# Patient Record
Sex: Male | Born: 1965
Health system: Southern US, Community
[De-identification: ages and names within clinical notes are randomized; demographics above are authoritative.]

## PROBLEM LIST (undated history)

## (undated) DIAGNOSIS — G4730 Sleep apnea, unspecified: Secondary | ICD-10-CM

## (undated) DIAGNOSIS — I10 Essential (primary) hypertension: Secondary | ICD-10-CM

## (undated) DIAGNOSIS — T783XXA Angioneurotic edema, initial encounter: Secondary | ICD-10-CM

## (undated) HISTORY — DX: Sleep apnea, unspecified: G47.30

## (undated) HISTORY — DX: Angioneurotic edema, initial encounter: T78.3XXA

## (undated) HISTORY — PX: OTHER SURGICAL HISTORY: SHX169

---

## 2016-07-02 ENCOUNTER — Inpatient Hospital Stay (HOSPITAL_COMMUNITY): Payer: Self-pay

## 2016-07-02 ENCOUNTER — Inpatient Hospital Stay (HOSPITAL_COMMUNITY)
Admission: EM | Admit: 2016-07-02 | Discharge: 2016-07-05 | DRG: 916 | Disposition: A | Discharge status: Home / Self Care | Payer: Self-pay | Attending: Family Medicine | Admitting: Family Medicine

## 2016-07-02 ENCOUNTER — Encounter (HOSPITAL_COMMUNITY): Payer: Self-pay | Admitting: Emergency Medicine

## 2016-07-02 DIAGNOSIS — I10 Essential (primary) hypertension: Secondary | ICD-10-CM | POA: Diagnosis present

## 2016-07-02 DIAGNOSIS — T464X5A Adverse effect of angiotensin-converting-enzyme inhibitors, initial encounter: Secondary | ICD-10-CM | POA: Diagnosis present

## 2016-07-02 DIAGNOSIS — T783XXA Angioneurotic edema, initial encounter: Principal | ICD-10-CM | POA: Diagnosis present

## 2016-07-02 DIAGNOSIS — T380X5A Adverse effect of glucocorticoids and synthetic analogues, initial encounter: Secondary | ICD-10-CM | POA: Diagnosis present

## 2016-07-02 DIAGNOSIS — D72829 Elevated white blood cell count, unspecified: Secondary | ICD-10-CM | POA: Diagnosis present

## 2016-07-02 HISTORY — DX: Essential (primary) hypertension: I10

## 2016-07-02 LAB — CBC
HEMATOCRIT: 44.2 % (ref 39.0–52.0)
Hemoglobin: 14.7 g/dL (ref 13.0–17.0)
MCH: 30.6 pg (ref 26.0–34.0)
MCHC: 33.3 g/dL (ref 30.0–36.0)
MCV: 91.9 fL (ref 78.0–100.0)
PLATELETS: 325 10*3/uL (ref 150–400)
RBC: 4.81 MIL/uL (ref 4.22–5.81)
RDW: 13.9 % (ref 11.5–15.5)
WBC: 16.3 10*3/uL — AB (ref 4.0–10.5)

## 2016-07-02 LAB — COMPREHENSIVE METABOLIC PANEL
ALK PHOS: 83 U/L (ref 38–126)
ALT: 40 U/L (ref 17–63)
AST: 20 U/L (ref 15–41)
Albumin: 3.7 g/dL (ref 3.5–5.0)
Anion gap: 11 (ref 5–15)
BILIRUBIN TOTAL: 0.7 mg/dL (ref 0.3–1.2)
BUN: 21 mg/dL — AB (ref 6–20)
CALCIUM: 9.4 mg/dL (ref 8.9–10.3)
CO2: 22 mmol/L (ref 22–32)
CREATININE: 1.04 mg/dL (ref 0.61–1.24)
Chloride: 101 mmol/L (ref 101–111)
Glucose, Bld: 115 mg/dL — ABNORMAL HIGH (ref 65–99)
Potassium: 4.1 mmol/L (ref 3.5–5.1)
Sodium: 134 mmol/L — ABNORMAL LOW (ref 135–145)
TOTAL PROTEIN: 7.8 g/dL (ref 6.5–8.1)

## 2016-07-02 LAB — CBC WITH DIFFERENTIAL/PLATELET
BASOS PCT: 0 %
Basophils Absolute: 0 10*3/uL (ref 0.0–0.1)
EOS ABS: 0.2 10*3/uL (ref 0.0–0.7)
Eosinophils Relative: 1 %
HCT: 44 % (ref 39.0–52.0)
Hemoglobin: 15 g/dL (ref 13.0–17.0)
LYMPHS ABS: 4.2 10*3/uL — AB (ref 0.7–4.0)
Lymphocytes Relative: 25 %
MCH: 31.4 pg (ref 26.0–34.0)
MCHC: 34.1 g/dL (ref 30.0–36.0)
MCV: 92.2 fL (ref 78.0–100.0)
MONO ABS: 1.4 10*3/uL — AB (ref 0.1–1.0)
Monocytes Relative: 8 %
Neutro Abs: 11.1 10*3/uL — ABNORMAL HIGH (ref 1.7–7.7)
Neutrophils Relative %: 66 %
PLATELETS: 312 10*3/uL (ref 150–400)
RBC: 4.77 MIL/uL (ref 4.22–5.81)
RDW: 13.8 % (ref 11.5–15.5)
WBC: 16.9 10*3/uL — ABNORMAL HIGH (ref 4.0–10.5)

## 2016-07-02 LAB — BASIC METABOLIC PANEL
Anion gap: 10 (ref 5–15)
BUN: 21 mg/dL — ABNORMAL HIGH (ref 6–20)
CO2: 26 mmol/L (ref 22–32)
Calcium: 9.9 mg/dL (ref 8.9–10.3)
Chloride: 100 mmol/L — ABNORMAL LOW (ref 101–111)
Creatinine, Ser: 1.29 mg/dL — ABNORMAL HIGH (ref 0.61–1.24)
GFR calc Af Amer: >60 mL/min (ref >60)
GLUCOSE: 120 mg/dL — AB (ref 65–99)
POTASSIUM: 3.7 mmol/L (ref 3.5–5.1)
Sodium: 136 mmol/L (ref 135–145)

## 2016-07-02 LAB — PROTIME-INR
INR: 0.97
Prothrombin Time: 12.8 seconds (ref 11.4–15.2)

## 2016-07-02 LAB — MRSA PCR SCREENING: MRSA by PCR: NEGATIVE

## 2016-07-02 LAB — GLUCOSE, CAPILLARY
GLUCOSE-CAPILLARY: 121 mg/dL — AB (ref 65–99)
GLUCOSE-CAPILLARY: 153 mg/dL — AB (ref 65–99)
GLUCOSE-CAPILLARY: 214 mg/dL — AB (ref 65–99)
Glucose-Capillary: 154 mg/dL — ABNORMAL HIGH (ref 65–99)

## 2016-07-02 LAB — SEDIMENTATION RATE: SED RATE: 42 mm/h — AB (ref 0–16)

## 2016-07-02 LAB — APTT: aPTT: 32 seconds (ref 24–36)

## 2016-07-02 LAB — PROCALCITONIN: PROCALCITONIN: 0.13 ng/mL

## 2016-07-02 MED ORDER — HEPARIN SODIUM (PORCINE) 5000 UNIT/ML IJ SOLN
5000.0000 [IU] | Freq: Three times a day (TID) | INTRAMUSCULAR | Status: DC
  Administered 2016-07-02 – 2016-07-03 (×3): 5000 [IU] via SUBCUTANEOUS
  Filled 2016-07-02 (×3): qty 1

## 2016-07-02 MED ORDER — FAMOTIDINE IN NACL 20-0.9 MG/50ML-% IV SOLN
20.0000 mg | Freq: Once | INTRAVENOUS | Status: AC
  Administered 2016-07-02: 20 mg via INTRAVENOUS
  Filled 2016-07-02: qty 50

## 2016-07-02 MED ORDER — DIPHENHYDRAMINE HCL 50 MG/ML IJ SOLN
25.0000 mg | Freq: Once | INTRAMUSCULAR | Status: AC
  Administered 2016-07-02: 25 mg via INTRAVENOUS
  Filled 2016-07-02: qty 1

## 2016-07-02 MED ORDER — INSULIN ASPART 100 UNIT/ML ~~LOC~~ SOLN
0.0000 [IU] | SUBCUTANEOUS | Status: DC
  Administered 2016-07-02 (×2): 2 [IU] via SUBCUTANEOUS
  Administered 2016-07-02 – 2016-07-03 (×3): 3 [IU] via SUBCUTANEOUS
  Administered 2016-07-03 (×2): 1 [IU] via SUBCUTANEOUS
  Administered 2016-07-04 (×2): 3 [IU] via SUBCUTANEOUS
  Administered 2016-07-04 (×2): 1 [IU] via SUBCUTANEOUS
  Administered 2016-07-05: 5 [IU] via SUBCUTANEOUS
  Administered 2016-07-05: 7 [IU] via SUBCUTANEOUS

## 2016-07-02 MED ORDER — METHYLPREDNISOLONE SODIUM SUCC 40 MG IJ SOLR
40.0000 mg | Freq: Two times a day (BID) | INTRAMUSCULAR | Status: DC
  Administered 2016-07-02: 40 mg via INTRAVENOUS
  Filled 2016-07-02: qty 1

## 2016-07-02 MED ORDER — DIPHENHYDRAMINE HCL 50 MG/ML IJ SOLN
25.0000 mg | Freq: Four times a day (QID) | INTRAMUSCULAR | Status: AC
  Administered 2016-07-02 – 2016-07-03 (×4): 25 mg via INTRAVENOUS
  Filled 2016-07-02 (×4): qty 1

## 2016-07-02 MED ORDER — SODIUM CHLORIDE 0.9 % IV SOLN
INTRAVENOUS | Status: DC
  Administered 2016-07-02: 10:00:00 via INTRAVENOUS

## 2016-07-02 MED ORDER — FAMOTIDINE IN NACL 20-0.9 MG/50ML-% IV SOLN
20.0000 mg | Freq: Two times a day (BID) | INTRAVENOUS | Status: DC
  Administered 2016-07-02 – 2016-07-05 (×6): 20 mg via INTRAVENOUS
  Filled 2016-07-02 (×7): qty 50

## 2016-07-02 MED ORDER — SODIUM CHLORIDE 0.9 % IV SOLN
INTRAVENOUS | Status: DC
  Administered 2016-07-02: 11:00:00 via INTRAVENOUS

## 2016-07-02 MED ORDER — METHYLPREDNISOLONE SODIUM SUCC 125 MG IJ SOLR
125.0000 mg | Freq: Once | INTRAMUSCULAR | Status: AC
  Administered 2016-07-02: 125 mg via INTRAVENOUS
  Filled 2016-07-02: qty 2

## 2016-07-02 NOTE — H&P (Signed)
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 Recent hospital stays just dc from [facility] with angioedema. Just dc and this am with progressive swelling under tongue. Able to mobilize secretions,. No SOB. No cp. No fevers. Able to phonate but abnormal from tongue. Presented to ER and called for admission. Was to see allergist but not completed. NO nEW meds except recent antihistamine at [facility]. No changes to home metop, hctz. No joint pain , rash   PAST MEDICAL HISTORY :   has a past medical history of Hypertension.  has no past surgical history on file. Prior to Admission medications   Not on File   Allergies not on file - ACEI  FAMILY HISTORY:  family history is not on file. no autoimmune, no c1qest inh def SOCIAL HISTORY:  reports that he has never smoked. He does not have any smokeless tobacco history on file. He reports that he does not drink alcohol.  REVIEW OF SYSTEMS:   Constitutional: Negative for fever, chills, weight loss, malaise/fatigue and diaphoresis.  HENT: Negative for hearing loss, ear pain, nosebleeds, congestion, sore throat, neck pain, tinnitus and ear discharge.   Eyes: Negative for blurred vision, double vision, photophobia, pain, discharge and redness.  Respiratory: Negative for cough, hemoptysis, sputum production, NO shortness of breath, wheezing and stridor.   Cardiovascular: Negative for chest pain, palpitations, orthopnea, claudication, leg swelling and PND.  Gastrointestinal: Negative for heartburn, nausea, vomiting, abdominal pain, diarrhea,  constipation, blood in stool and melena.  Genitourinary: Negative for dysuria, urgency, frequency, hematuria and flank pain.  Musculoskeletal: Negative for myalgias, back pain, joint pain and falls.  Skin: Negative for itching and rash.  Neurological: Negative for dizziness, tingling, tremors, sensory change, speech change, focal weakness, seizures, loss of consciousness, weakness and headaches.  Endo/Heme/Allergies: Negative for environmental allergies and polydipsia. Does not bruise/bleed easily.  SUBJECTIVE: no sob  VITAL SIGNS: Temp:  [98.2 F (36.8 C)] 98.2 F (36.8 C) (03/30 0922) Pulse Rate:  [87-90] 87 (03/30 0945) Resp:  [13-15] 13 (03/30 0945) BP: (146-160)/(107-115) 146/107 (03/30 0930) SpO2:  [97 %-100 %] 97 % (03/30 0945) Weight:  [95.3 kg (210 lb)] 95.3 kg (210 lb) (03/30 0954)  PHYSICAL EXAMINATION: General:  Awake, laert, no distress Neuro:  Alert  o x 4 HEENT:  Submandibular swelling, not warm, no rash, tongue ant wnl, under tongue swollen, can see phayngeal arches Cardiovascular:  s1 s2 RRR Lungs:  CTA Abdomen:  Soft, BS wnl, no r Musculoskeletal:  No edema, joints wnl Skin:  No rash  No results for input(s): NA, K, CL, CO2, BUN, CREATININE, GLUCOSE in the last 168 hours.  Recent Labs Lab 07/02/16 0942  HGB 15.0  HCT 44.0  WBC 16.9*  PLT 312   No results found.  ASSESSMENT / PLAN:  Angioedema , unclear etiology r/o compliment deficiency, r/o c1q est inh def r/o autoimmune  -empiric benadryl x 4 doses -pepcid 20 q12h bid -solumedral 40 bid -assess c1q esterase, c3, c4 -assess autoimmune panel, rf, ana, esr -assess lFT, lytes -NPO, will need SLp as involving base tongue -may require empiric  berinert if declines -xray chest -xray ap / lat neck -may need ent assessment, hope will take same clinical course as noted at [facility] with improvements I updated girlfriend and pt in full  Lavon Paganini. Titus Mould, MD, [place] Pgr: [facility]  Pulmonary and [place] Pager: [phone]  07/02/2016, 10:01 AM

## 2016-07-02 NOTE — ED Notes (Signed)
Dr feinstein at bedside  

## 2016-07-02 NOTE — ED Provider Notes (Signed)
[facility] DEPT Provider Note   CSN: [id number] Arrival date & time: 07/02/16  0918     History   Chief Complaint Chief Complaint  Patient presents with  . Allergic Reaction    HPI Robert Reyes is a 51 y.o. male.  51 year old male presents with upper airway edema as well as trouble swallowing consistent with his prior history of angioedema. Has been hospitalized several times for similar symptoms. Took Benadryl prior to arrival without relief. Denies any current use of Ace inhibitors. States he can handle his secretions. Symptoms have been progressively worse since this morning and now with increased swelling below his jaw. Denies any dyspnea.      Past Medical History:  Diagnosis Date  . Hypertension     Patient Active Problem List   Diagnosis Date Noted  . Angioedema 07/02/2016    History reviewed. No pertinent surgical history.     Home Medications    Prior to Admission medications   Medication Sig Start Date End Date Taking? Authorizing Provider  diphenhydrAMINE (BENADRYL) 25 MG tablet Take 25 mg by mouth every 8 (eight) hours.   Yes Historical Provider, MD  famotidine (PEPCID) 20 MG tablet Take 20 mg by mouth 2 (two) times daily.   Yes Historical Provider, MD  hydrochlorothiazide (HYDRODIURIL) 25 MG tablet Take 25 mg by mouth daily.   Yes Historical Provider, MD  metoprolol succinate (TOPROL-XL) 50 MG 24 hr tablet Take 50 mg by mouth daily. 06/12/16  Yes Historical Provider, MD    Family History No family history on file.  Social History Social History  Substance Use Topics  . Smoking status: Never Smoker  . Smokeless tobacco: Not on file  . Alcohol use No     Allergies   Ace inhibitors   Review of Systems Review of Systems  All other systems reviewed and are negative.    Physical Exam Updated Vital Signs BP (!) 145/108   Pulse 89   Temp 98.2 F (36.8 C) (Oral)   Resp 16   Ht 6' (1.829 m)   Wt 95.3 kg   SpO2 100%   BMI  28.48 kg/m   Physical Exam  Constitutional: He is oriented to person, place, and time. He appears well-developed and well-nourished.  Non-toxic appearance. No distress.  HENT:  Head: Normocephalic and atraumatic.  Mouth/Throat:    Eyes: Conjunctivae, EOM and lids are normal. Pupils are equal, round, and reactive to light.  Neck: Normal range of motion. Neck supple. No tracheal deviation present. No thyroid mass present.    Cardiovascular: Normal rate, regular rhythm and normal heart sounds.  Exam reveals no gallop.   No murmur heard. Pulmonary/Chest: Effort normal and breath sounds normal. No stridor. No respiratory distress. He has no decreased breath sounds. He has no wheezes. He has no rhonchi. He has no rales.  Abdominal: Soft. Normal appearance and bowel sounds are normal. He exhibits no distension. There is no tenderness. There is no rebound and no CVA tenderness.  Musculoskeletal: Normal range of motion. He exhibits no edema or tenderness.  Neurological: He is alert and oriented to person, place, and time. He has normal strength. No cranial nerve deficit or sensory deficit. GCS eye subscore is 4. GCS verbal subscore is 5. GCS motor subscore is 6.  Skin: Skin is warm and dry. No abrasion and no rash noted.  Psychiatric: He has a normal mood and affect. His speech is normal and behavior is normal.  Nursing note and vitals reviewed.  ED Treatments / Results  Labs (all labs ordered are listed, but only abnormal results are displayed) Labs Reviewed  CBC WITH DIFFERENTIAL/PLATELET - Abnormal; Notable for the following:       Result Value   WBC 16.9 (*)    Neutro Abs 11.1 (*)    Lymphs Abs 4.2 (*)    Monocytes Absolute 1.4 (*)    All other components within normal limits  BASIC METABOLIC PANEL - Abnormal; Notable for the following:    Chloride 100 (*)    Glucose, Bld 120 (*)    BUN 21 (*)    Creatinine, Ser 1.29 (*)    All other components within normal limits  HIV  ANTIBODY (ROUTINE TESTING)  CBC  COMPREHENSIVE METABOLIC PANEL  PROCALCITONIN  PROTIME-INR  APTT  C1 ESTERASE INHIBITOR  C3 COMPLEMENT  C4 COMPLEMENT  COMPLEMENT, TOTAL  ANA W/REFLEX IF POSITIVE  RHEUMATOID FACTOR  SEDIMENTATION RATE    EKG  EKG Interpretation None       Radiology No results found.  Procedures Procedures (including critical care time)  Medications Ordered in ED Medications  0.9 %  sodium chloride infusion ( Intravenous Stopped 07/02/16 1011)  heparin injection 5,000 Units (not administered)  famotidine (PEPCID) IVPB 20 mg premix (not administered)  0.9 %  sodium chloride infusion ( Intravenous Rate/Dose Change 07/02/16 1011)  methylPREDNISolone sodium succinate (SOLU-MEDROL) 40 mg/mL injection 40 mg (not administered)  diphenhydrAMINE (BENADRYL) injection 25 mg (not administered)  insulin aspart (novoLOG) injection 0-9 Units (not administered)  methylPREDNISolone sodium succinate (SOLU-MEDROL) 125 mg/2 mL injection 125 mg (125 mg Intravenous Given 07/02/16 0951)  famotidine (PEPCID) IVPB 20 mg premix (20 mg Intravenous New Bag/Given 07/02/16 0951)  diphenhydrAMINE (BENADRYL) injection 25 mg (25 mg Intravenous Given 07/02/16 0954)     Initial Impression / Assessment and Plan / ED Course  I have reviewed the triage vital signs and the nursing notes.  Pertinent labs & imaging results that were available during my care of the patient were reviewed by me and considered in my medical decision making (see chart for details).    CRITICAL CARE Performed by: Leota Jacobsen Total critical care time: 45 minutes Critical care time was exclusive of separately billable procedures and treating other patients. Critical care was necessary to treat or prevent imminent or life-threatening deterioration. Critical care was time spent personally by me on the following activities: development of treatment plan with patient and/or surrogate as well as nursing, discussions  with consultants, evaluation of patient's response to treatment, examination of patient, obtaining history from patient or surrogate, ordering and performing treatments and interventions, ordering and review of laboratory studies, ordering and review of radiographic studies, pulse oximetry and re-evaluation of patient's condition.   Patient treated with Solu-Medrol Benadryl and Pepcid. Discuss with Dr. Titus Mould from critical care and was seen the patient and patient will be admitted to the ICU.  Final Clinical Impressions(s) / ED Diagnoses   Final diagnoses:  Angioedema    New Prescriptions New Prescriptions   No medications on file     Lacretia Leigh, MD 07/02/16 1031

## 2016-07-02 NOTE — ED Notes (Signed)
X-ray at bedside

## 2016-07-02 NOTE — ED Triage Notes (Signed)
Per pt, states he was recently hospitalized in [place] for the same symptoms-swollen tongue and neck-states he was released on Tuesday-states he was to follow up with allergist-states woke up this am with swollen tongue and neck

## 2016-07-03 LAB — GLUCOSE, CAPILLARY
GLUCOSE-CAPILLARY: 147 mg/dL — AB (ref 65–99)
Glucose-Capillary: 149 mg/dL — ABNORMAL HIGH (ref 65–99)
Glucose-Capillary: 138 mg/dL — ABNORMAL HIGH (ref 65–99)
Glucose-Capillary: 129 mg/dL — ABNORMAL HIGH (ref 65–99)
Glucose-Capillary: 216 mg/dL — ABNORMAL HIGH (ref 65–99)
Glucose-Capillary: 223 mg/dL — ABNORMAL HIGH (ref 65–99)

## 2016-07-03 LAB — CBC
HEMATOCRIT: 41.4 % (ref 39.0–52.0)
Hemoglobin: 13.8 g/dL (ref 13.0–17.0)
MCH: 30.7 pg (ref 26.0–34.0)
MCHC: 33.3 g/dL (ref 30.0–36.0)
MCV: 92 fL (ref 78.0–100.0)
Platelets: 334 10*3/uL (ref 150–400)
RBC: 4.5 MIL/uL (ref 4.22–5.81)
RDW: 14 % (ref 11.5–15.5)
WBC: 20.9 10*3/uL — ABNORMAL HIGH (ref 4.0–10.5)

## 2016-07-03 LAB — BASIC METABOLIC PANEL
Anion gap: 10 (ref 5–15)
BUN: 24 mg/dL — AB (ref 6–20)
CO2: 25 mmol/L (ref 22–32)
Calcium: 9.5 mg/dL (ref 8.9–10.3)
Chloride: 102 mmol/L (ref 101–111)
Creatinine, Ser: 1.24 mg/dL (ref 0.61–1.24)
GFR calc Af Amer: >60 mL/min (ref >60)
GLUCOSE: 150 mg/dL — AB (ref 65–99)
POTASSIUM: 4.3 mmol/L (ref 3.5–5.1)
Sodium: 137 mmol/L (ref 135–145)

## 2016-07-03 LAB — HIV ANTIBODY (ROUTINE TESTING W REFLEX): HIV SCREEN 4TH GENERATION: NONREACTIVE

## 2016-07-03 LAB — RHEUMATOID FACTOR

## 2016-07-03 LAB — C4 COMPLEMENT: COMPLEMENT C4, BODY FLUID: 46 mg/dL — AB (ref 14–44)

## 2016-07-03 LAB — PHOSPHORUS: PHOSPHORUS: 3.6 mg/dL (ref 2.5–4.6)

## 2016-07-03 LAB — ANA W/REFLEX IF POSITIVE: ANA: NEGATIVE

## 2016-07-03 LAB — C3 COMPLEMENT: C3 COMPLEMENT: 215 mg/dL — AB (ref 82–167)

## 2016-07-03 LAB — MAGNESIUM: Magnesium: 2 mg/dL (ref 1.7–2.4)

## 2016-07-03 MED ORDER — METOPROLOL SUCCINATE ER 50 MG PO TB24
50.0000 mg | ORAL_TABLET | Freq: Every day | ORAL | Status: DC
  Administered 2016-07-03 – 2016-07-05 (×3): 50 mg via ORAL
  Filled 2016-07-03 (×3): qty 1

## 2016-07-03 MED ORDER — HYDROCHLOROTHIAZIDE 25 MG PO TABS
25.0000 mg | ORAL_TABLET | Freq: Every day | ORAL | Status: DC
  Administered 2016-07-03 – 2016-07-05 (×3): 25 mg via ORAL
  Filled 2016-07-03 (×3): qty 1

## 2016-07-03 MED ORDER — PREDNISONE 20 MG PO TABS
20.0000 mg | ORAL_TABLET | Freq: Two times a day (BID) | ORAL | Status: DC
  Administered 2016-07-03 – 2016-07-05 (×5): 20 mg via ORAL
  Filled 2016-07-03 (×5): qty 1

## 2016-07-03 NOTE — H&P (Signed)
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 Recent hospital stays just dc from [facility] with angioedema. Just dc and this am with progressive swelling under tongue. Able to mobilize secretions,. No SOB. No cp. No fevers. Able to phonate but abnormal from tongue. Presented to ER and called for admission. Was to see allergist but not completed. NO nEW meds except recent antihistamine at [facility]. No changes to home metop, hctz. No joint pain , rash   SUBJECTIVE: MAJOR improved swelling below tongue  VITAL SIGNS: Temp:  [98 F (36.7 C)-98.9 F (37.2 C)] 98.4 F (36.9 C) (03/31 0000) Pulse Rate:  [72-90] 74 (03/31 0400) Resp:  [12-25] 16 (03/31 0400) BP: (116-177)/(82-115) 116/82 (03/31 0400) SpO2:  [95 %-100 %] 96 % (03/31 0400) Weight:  [89.1 kg (196 lb 6.9 oz)-95.3 kg (210 lb)] 89.1 kg (196 lb 6.9 oz) (03/31 0500)  PHYSICAL EXAMINATION: General:  Awake, laert, no distress Neuro:  Alert  o x 4 HEENT:  Submandibular swelling near resolved Cardiovascular:  s1 s2 RRR Lungs:  CTA Abdomen:  Soft, BS wnl, no r Musculoskeletal:  No edema, joints wnl Skin:  No rash   Recent Labs Lab 07/02/16 0942 07/02/16 1112 07/03/16 0310  NA 136 134* 137  K 3.7 4.1 4.3  CL 100* 101 102  CO2 26 22 25   BUN 21* 21* 24*  CREATININE 1.29* 1.04 1.24  GLUCOSE 120* 115* 150*    Recent Labs Lab 07/02/16 0942 07/02/16 1112 07/03/16 0310  HGB 15.0 14.7 13.8  HCT 44.0 44.2 41.4  WBC 16.9* 16.3* 20.9*  PLT 312 325 334   Dg Neck Soft Tissue  Result Date: 07/02/2016 CLINICAL DATA:   Swollen tongue and neck. EXAM: NECK SOFT TISSUES - 1+ VIEW COMPARISON:  None. FINDINGS: There is no evidence of retropharyngeal soft tissue swelling or epiglottic enlargement. The cervical airway is unremarkable and no radio-opaque foreign body identified. There does appear to be prominence of the submandibular soft tissues of unknown etiology. IMPRESSION: Prominence of the submandibular tissues is noted of unknown etiology. Airway appears to be patent. CT scan may be performed for further evaluation. Electronically Signed   By: Marijo Conception, M.D.   On: 07/02/2016 10:49   Dg Chest Port 1 View  Result Date: 07/02/2016 CLINICAL DATA:  Difficulty swallowing with tongue swelling. EXAM: PORTABLE CHEST 1 VIEW COMPARISON:  None. FINDINGS: 1011 hours. The lungs are clear wiithout focal pneumonia, edema, pneumothorax or pleural effusion. The cardiopericardial silhouette is within normal limits for size. The visualized bony structures of the thorax are intact. Telemetry leads overlie the chest. IMPRESSION: No acute cardiopulmonary findings. Electronically Signed   By: Misty Stanley M.D.   On: 07/02/2016 10:43    ASSESSMENT / PLAN:  Angioedema , unclear etiology - favor Hereditary  r/o compliment deficiency, r/o c1q est inh def r/o autoimmune ( unlikely) No infection evident  -empiric benadryl x 4 doses - allow to dc -pepcid 20 q12h bid, maintain -solumedral 40 bid to pred this am  -assess c1q esterase ( pending) , c3, c4- reviewed - care everywhere labs  similar reviewed from [facility] -assess autoimmune panel, rf (neg) , ana, esr pending to follow -needs an allergist prior to dc home again, does he need BERINERT? - C1 Inh human pre discharge -dc sub q hep and ambulate -to [facility], floor -advance diet -crt at baseline likely  Lavon Paganini. Titus Mould, MD, [place] Pgr: [facility]  Pulmonary and [place] Pager: [phone]  07/03/2016, 7:52  AM

## 2016-07-04 LAB — GLUCOSE, CAPILLARY
GLUCOSE-CAPILLARY: 143 mg/dL — AB (ref 65–99)
GLUCOSE-CAPILLARY: 202 mg/dL — AB (ref 65–99)
Glucose-Capillary: 109 mg/dL — ABNORMAL HIGH (ref 65–99)
Glucose-Capillary: 111 mg/dL — ABNORMAL HIGH (ref 65–99)
Glucose-Capillary: 211 mg/dL — ABNORMAL HIGH (ref 65–99)
Glucose-Capillary: 322 mg/dL — ABNORMAL HIGH (ref 65–99)

## 2016-07-04 MED ORDER — HYDRALAZINE HCL 20 MG/ML IJ SOLN: 10.0000 mg | Freq: Four times a day (QID) | INTRAMUSCULAR | Status: DC | PRN

## 2016-07-04 NOTE — Progress Notes (Signed)
Patient Demographics:    Robert Reyes, is a 51 y.o. male, DOB - December 12, 1965, XBM:[id number]  Admit date - 07/02/2016   Admitting Physician Raylene Miyamoto, MD  Outpatient Primary MD for the patient is No PCP Per Patient  LOS - 2   Chief Complaint  Patient presents with  . Allergic Reaction        Subjective:    Robert Reyes today has no fevers, no emesis,  No chest pain,  No further tongue swelling   Assessment  & Plan :    Active Problems:   Angioedema  1)Angioedema , unclear etiology - favor Hereditary , patient continues to have recurrent episodes of tongue and lip swelling even after stopping ACE inhibitor. His brother apparently had is induced angioedema as well. Need to rule out hereditary angioedema, r/o compliment deficiency, r/o c1q est inh def, r/o autoimmune ( unlikely), No infection evident. Continue steroids, Benadryl and Pepcid, patient will need outpatient follow-up with allergist and immunologist. assess c1q esterase ( pending) , c3, c4- reviewed - -assess autoimmune panel, rf (neg) , ana, esr pending to follow -needs an allergist prior to dc home again, does he need BERINERT? - C1 Inh human pre discharge  2)HTN- stable continue metoprolol,  may use IV Hydralazine 10 mg  Every 4 hours Prn for systolic blood pressure over 160 mmhg  Code Status : Full    Disposition Plan  : home   Consults  :  ICU   DVT Prophylaxis  :    SCDs   Lab Results  Component Value Date   PLT 334 07/03/2016    Inpatient Medications  Scheduled Meds: . famotidine (PEPCID) IV  20 mg Intravenous Q12H  . hydrochlorothiazide  25 mg Oral Daily  . insulin aspart  0-9 Units Subcutaneous Q4H  . metoprolol succinate  50 mg Oral Daily  . predniSONE  20 mg Oral BID WC   Continuous Infusions: PRN Meds:.    Anti-infectives    None        Objective:   Vitals:   07/03/16 2023  07/03/16 2137 07/04/16 0500 07/04/16 0537  BP: (!) 145/108 (!) 131/101  (!) 135/103  Pulse: 83   64  Resp: 16   18  Temp: 98.5 F (36.9 C)   98 F (36.7 C)  TempSrc: Oral   Oral  SpO2: 99%   100%  Weight:   86.4 kg (190 lb 7.6 oz)   Height:        Wt Readings from Last 3 Encounters:  07/04/16 86.4 kg (190 lb 7.6 oz)     Intake/Output Summary (Last 24 hours) at 07/04/16 0802 Last data filed at 07/04/16 0537  Gross per 24 hour  Intake             1150 ml  Output             1200 ml  Net              -50 ml     Physical Exam  Gen:- Awake Alert,  In no apparent distress  HEENT:- [place].AT, No sclera icterus. No Tongue swelling Neck-Supple Neck,No JVD,.  Lungs-  CTAB  CV- S1, S2 normal Abd-  +ve B.Sounds, Abd Soft, No  tenderness,    Extremity/Skin:- No  edema,       Data Review:   Micro Results Recent Results (from the past 240 hour(s))  MRSA PCR Screening     Status: None   Collection Time: 07/02/16 11:00 AM  Result Value Ref Range Status   MRSA by PCR NEGATIVE NEGATIVE Final    Comment:        The GeneXpert MRSA Assay (FDA approved for NASAL specimens only), is one component of a comprehensive MRSA colonization surveillance program. It is not intended to diagnose MRSA infection nor to guide or monitor treatment for MRSA infections.     Radiology Reports Dg Neck Soft Tissue  Result Date: 07/02/2016 CLINICAL DATA:  Swollen tongue and neck. EXAM: NECK SOFT TISSUES - 1+ VIEW COMPARISON:  None. FINDINGS: There is no evidence of retropharyngeal soft tissue swelling or epiglottic enlargement. The cervical airway is unremarkable and no radio-opaque foreign body identified. There does appear to be prominence of the submandibular soft tissues of unknown etiology. IMPRESSION: Prominence of the submandibular tissues is noted of unknown etiology. Airway appears to be patent. CT scan may be performed for further evaluation. Electronically Signed   By: Marijo Conception, M.D.    On: 07/02/2016 10:49   Dg Chest Port 1 View  Result Date: 07/02/2016 CLINICAL DATA:  Difficulty swallowing with tongue swelling. EXAM: PORTABLE CHEST 1 VIEW COMPARISON:  None. FINDINGS: 1011 hours. The lungs are clear wiithout focal pneumonia, edema, pneumothorax or pleural effusion. The cardiopericardial silhouette is within normal limits for size. The visualized bony structures of the thorax are intact. Telemetry leads overlie the chest. IMPRESSION: No acute cardiopulmonary findings. Electronically Signed   By: Misty Stanley M.D.   On: 07/02/2016 10:43     CBC  Recent Labs Lab 07/02/16 0942 07/02/16 1112 07/03/16 0310  WBC 16.9* 16.3* 20.9*  HGB 15.0 14.7 13.8  HCT 44.0 44.2 41.4  PLT 312 325 334  MCV 92.2 91.9 92.0  MCH 31.4 30.6 30.7  MCHC 34.1 33.3 33.3  RDW 13.8 13.9 14.0  LYMPHSABS 4.2*  --   --   MONOABS 1.4*  --   --   EOSABS 0.2  --   --   BASOSABS 0.0  --   --     Chemistries   Recent Labs Lab 07/02/16 0942 07/02/16 1112 07/03/16 0310  NA 136 134* 137  K 3.7 4.1 4.3  CL 100* 101 102  CO2 _0 GLUCOSE 120* 115* 150*  BUN 21* 21* 24*  CREATININE 1.29* 1.04 1.24  CALCIUM 9.9 9.4 9.5  MG  --   --  2.0  AST  --  20  --   ALT  --  40  --   ALKPHOS  --  83  --   BILITOT  --  0.7  --    ------------------------------------------------------------------------------------------------------------------ No results for input(s): CHOL, HDL, LDLCALC, TRIG, CHOLHDL, LDLDIRECT in the last 72 hours.  No results found for: HGBA1C ------------------------------------------------------------------------------------------------------------------ No results for input(s): TSH, T4TOTAL, T3FREE, THYROIDAB in the last 72 hours.  Invalid input(s): FREET3 ------------------------------------------------------------------------------------------------------------------ No results for input(s): VITAMINB12, FOLATE, FERRITIN, TIBC, IRON, RETICCTPCT in the last 72  hours.  Coagulation profile  Recent Labs Lab 07/02/16 1112  INR 0.97    No results for input(s): DDIMER in the last 72 hours.  Cardiac Enzymes No results for input(s): CKMB, TROPONINI, MYOGLOBIN in the last 168 hours.  Invalid input(s): CK ------------------------------------------------------------------------------------------------------------------ No results found for: BNP  Denton Brick Orbin Mayeux M.D on 07/04/2016 at 8:02 AM  Between 7am to 7pm - Pager - [phone]  After 7pm go to [email or web address] - password [email or web address]  [facility] -  Office  [phone]  Dragon dictation system was used to create this note, attempts have been made to correct errors, however presence of uncorrected errors is not a reflection quality of care provided

## 2016-07-05 LAB — CBC
HCT: 40.1 % (ref 39.0–52.0)
Hemoglobin: 13.1 g/dL (ref 13.0–17.0)
MCH: 30.3 pg (ref 26.0–34.0)
MCHC: 32.7 g/dL (ref 30.0–36.0)
MCV: 92.8 fL (ref 78.0–100.0)
Platelets: 326 10*3/uL (ref 150–400)
RBC: 4.32 MIL/uL (ref 4.22–5.81)
RDW: 14 % (ref 11.5–15.5)
WBC: 18.2 10*3/uL — ABNORMAL HIGH (ref 4.0–10.5)

## 2016-07-05 LAB — GLUCOSE, CAPILLARY
GLUCOSE-CAPILLARY: 253 mg/dL — AB (ref 65–99)
GLUCOSE-CAPILLARY: 117 mg/dL — AB (ref 65–99)
Glucose-Capillary: 109 mg/dL — ABNORMAL HIGH (ref 65–99)

## 2016-07-05 LAB — C1 ESTERASE INHIBITOR: C1 ESTERASE INH: 51 mg/dL — AB (ref 21–39)

## 2016-07-05 MED ORDER — FAMOTIDINE 20 MG PO TABS
20.0000 mg | ORAL_TABLET | Freq: Two times a day (BID) | ORAL | 1 refills | Status: DC
Start: 2016-07-05 — End: 2016-08-16

## 2016-07-05 MED ORDER — PREDNISONE 20 MG PO TABS: 20.0000 mg | ORAL_TABLET | Freq: Every day | ORAL | 0 refills | Status: DC

## 2016-07-05 MED ORDER — DIPHENHYDRAMINE HCL 25 MG PO TABS: 25.0000 mg | ORAL_TABLET | Freq: Three times a day (TID) | ORAL | 1 refills | Status: DC | PRN

## 2016-07-05 MED ORDER — AMLODIPINE BESYLATE 10 MG PO TABS: 10.0000 mg | ORAL_TABLET | Freq: Every day | ORAL | 0 refills | Status: DC

## 2016-07-05 MED FILL — predniSONE 20 MG TABS: 20 | 20 days supply | Qty: 20 | Fill #0

## 2016-07-05 MED FILL — AMLODIPINE BESYLATE 10 MG T: 10 | 30 days supply | Qty: 30 | Fill #0

## 2016-07-05 NOTE — Discharge Summary (Addendum)
Robert Reyes, is a 51 y.o. male  DOB 09/23/1965  MRN [id number].  Admission date:  07/02/2016  Admitting Physician  Raylene Miyamoto, MD  Discharge Date:  07/05/2016   Primary MD  No PCP Per Patient  Recommendations for primary care physician for things to follow:   Follow-up with allergist as advised   Admission Diagnosis  Angioedema [T78.3XXA] Angioedema, initial encounter [T78.3XXA]   Discharge Diagnosis  Angioedema [T78.3XXA] Angioedema, initial encounter [T78.3XXA]    Active Problems:   Angioedema      Past Medical History:  Diagnosis Date  . Hypertension     Past Surgical History:  Procedure Laterality Date  . surgery on genitals    . 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 Recent hospital stays just dc from [facility] with angioedema. Just dc and this am with progressive swelling under tongue. Able to mobilize secretions,. No SOB. No cp. No fevers. Able to phonate but abnormal from tongue. Presented to ER and called for admission. Was to see allergist but not completed. NO nEW meds except recent antihistamine at [place]. No changes to home metop, hctz. No joint pain , rash     Hospital Course:     1)Angioedema , unclear etiology - favor Hereditary , patient continues to have recurrent episodes of tongue and lip swelling even after stopping ACE inhibitor. His brother apparently had ACEI induced angioedema as well. Need to rule out hereditary angioedema ( C1 esterase inhibitor is pending to R/o c1q est  inh def) . C4 and C3 are elevated so NO compliment deficiency,  ESR is only 42, ANA and Rheumatoid are Neg so autoimmune is unlikely), No infection evident (pro-calcitonin is not elevated). Treated with steroids, Benadryl and Pepcid. Discharge home on same.  ???? does he need [place]  appointment with Dr. Jerel Shepherd (Allergist) on Thursday 07/22/16... At 1330 PM   2)HTN-  ACE inhibitor related angioedema, continues to have symptoms metoprolol and HCTZ,  stop HCTZ and metoprolol, use amlodipine for BP control for now .  3)Steroid induced Leukocytosis- no fevers no evidence of infection, pro-calcitonin was not elevated   Discharge instructions:-  1)You have ReCurrent Angioedema which can be life-threatening 2) we are not sure what is causing you angioedema, it may be hereditary, it may also be triggered by medications or other exposures 3)Stop hydrochlorothiazide and metoprolol 4)Start Amlodipine 10 mg daily for blood pressure 5)Continue Benadryl as needed, take Pepcid twice a day and Prednisone 20 mg daily until you are told to stop these medications by the allergy Physician 6)You have an appointment with Dr. Jerel Shepherd on  Thursday 07/22/16... At 1330 PM at [facility] in [place], [place] [address]. (phone Number... [phone]) please be sure to keep your appointment 8) please take oral medications along including over-the-counter medications to your appointment 9)Go to the nearest emergency room if your tongue swelling and difficulty breathing occurs again or call 911  Discharge Condition: Stable  Follow UP with Dr. Verlin Fester the allergies as advised   Consults obtained - Dr Titus Mould  Diet and Activity recommendation:  As advised  Discharge Instructions    Discharge Instructions    Call MD for:  difficulty breathing, headache or visual disturbances    Complete by:  As directed    Call MD for:  hives    Complete by:  As directed    Call MD for:  persistant dizziness or  light-headedness    Complete by:  As directed    Call MD for:  persistant nausea and vomiting    Complete by:  As directed    Call MD for:  temperature >100.4    Complete by:  As directed    Diet - low sodium heart healthy    Complete by:  As directed    Discharge instructions    Complete by:  As directed    1)You have ReCurrent Angioedema which can be life-threatening 2) we are not sure what is causing you angioedema, it may be hereditary, it may also be triggered by medications or other exposures 3)Stop hydrochlorothiazide and metoprolol 4)Start Amlodipine 10 mg daily for blood pressure 5)Continue Benadryl as needed, take Pepcid twice a day and Prednisone 20 mg daily until you are told to stop these medications by the allergy Physician 6)You have an appointment with Dr. Jerel Shepherd on Thursday 07/22/16... At 1330 PM at [facility] in [place], [place] [address]. (phone Number... [phone]) please be sure to keep your appointment 8) please take oral medications along including over-the-counter medications to your appointment 9)Go to the nearest emergency room if your tongue swelling and difficulty breathing occurs again or call 911   Increase activity slowly    Complete by:  As directed         Discharge Medications     Allergies as of 07/05/2016      Reactions   Ace Inhibitors Swelling   Angioedema      Medication List    STOP taking these medications   hydrochlorothiazide 25 MG tablet Commonly known as:  HYDRODIURIL   metoprolol succinate 50 MG 24 hr tablet Commonly known as:  TOPROL-XL     TAKE these medications   amLODipine 10 MG tablet Commonly known as:  NORVASC Take 1 tablet (10 mg total) by mouth daily.   diphenhydrAMINE 25 MG tablet Commonly known as:  BENADRYL Take 1 tablet (25 mg total) by mouth every 8 (eight) hours as needed. What changed:  when to take this  reasons to take this   famotidine 20 MG tablet Commonly known as:  PEPCID Take 1  tablet (20 mg total) by mouth 2 (two) times daily.   predniSONE 20 MG tablet Commonly known as:  DELTASONE Take 1 tablet (20 mg total) by mouth daily with breakfast.       Major procedures and Radiology Reports - PLEASE review detailed and final reports for all details, in brief -   Dg Neck Soft Tissue  Result Date: 07/02/2016 CLINICAL DATA:  Swollen tongue and neck. EXAM: NECK SOFT TISSUES - 1+ VIEW COMPARISON:  None. FINDINGS: There is no evidence  of retropharyngeal soft tissue swelling or epiglottic enlargement. The cervical airway is unremarkable and no radio-opaque foreign body identified. There does appear to be prominence of the submandibular soft tissues of unknown etiology. IMPRESSION: Prominence of the submandibular tissues is noted of unknown etiology. Airway appears to be patent. CT scan may be performed for further evaluation. Electronically Signed   By: Marijo Conception, M.D.   On: 07/02/2016 10:49   Dg Chest Port 1 View  Result Date: 07/02/2016 CLINICAL DATA:  Difficulty swallowing with tongue swelling. EXAM: PORTABLE CHEST 1 VIEW COMPARISON:  None. FINDINGS: 1011 hours. The lungs are clear wiithout focal pneumonia, edema, pneumothorax or pleural effusion. The cardiopericardial silhouette is within normal limits for size. The visualized bony structures of the thorax are intact. Telemetry leads overlie the chest. IMPRESSION: No acute cardiopulmonary findings. Electronically Signed   By: Misty Stanley M.D.   On: 07/02/2016 10:43    Micro Results   Recent Results (from the past 240 hour(s))  MRSA PCR Screening     Status: None   Collection Time: 07/02/16 11:00 AM  Result Value Ref Range Status   MRSA by PCR NEGATIVE NEGATIVE Final    Comment:        The GeneXpert MRSA Assay (FDA approved for NASAL specimens only), is one component of a comprehensive MRSA colonization surveillance program. It is not intended to diagnose MRSA infection nor to guide or monitor treatment  for MRSA infections.     Today   Subjective    Robert Reyes today has no new complaints, speech is normal, eating and drinking well, no rash, no itching, girlfriend at bedside          Patient has been seen and examined prior to discharge   Objective   Blood pressure 139/90, pulse 69, temperature 98.3 F (36.8 C), temperature source Oral, resp. rate 16, height 6' (1.829 m), weight 86.3 kg (190 lb 4.1 oz), SpO2 100 %.   Intake/Output Summary (Last 24 hours) at 07/05/16 1459 Last data filed at 07/05/16 1354  Gross per 24 hour  Intake              840 ml  Output                0 ml  Net              840 ml    Exam Gen:- Awake  In no apparent distress , speech is completely clear HEENT:- [place].AT, No tongue swelling, no oral pharyngeal swelling , no swelling of the lips or neck Neck-Supple Neck,No JVD, prior trach scar Lungs- mostly clear  CV- S1, S2 normal Abd-  +ve B.Sounds, Abd Soft, No tenderness,    Extremity/Skin:- Intact peripheral pulses     Data Review   CBC w Diff:  Lab Results  Component Value Date   WBC 18.2 (H) 07/05/2016   HGB 13.1 07/05/2016   HCT 40.1 07/05/2016   PLT 326 07/05/2016   LYMPHOPCT 25 07/02/2016   MONOPCT 8 07/02/2016   EOSPCT 1 07/02/2016   BASOPCT 0 07/02/2016    CMP:  Lab Results  Component Value Date   NA 137 07/03/2016   K 4.3 07/03/2016   CL 102 07/03/2016   CO2 25 07/03/2016   BUN 24 (H) 07/03/2016   CREATININE 1.24 07/03/2016   PROT 7.8 07/02/2016   ALBUMIN 3.7 07/02/2016   BILITOT 0.7 07/02/2016   ALKPHOS 83 07/02/2016   AST 20 07/02/2016   ALT  40 07/02/2016    Total Discharge time is about 33 minutes  Kandyce Dieguez M.D on 07/05/2016 at 2:59 PM  [facility]   Office  [phone]  Dragon dictation system was used to create this note, attempts have been made to correct errors, however presence of uncorrected errors is not a reflection quality of care provided

## 2016-07-05 NOTE — Progress Notes (Signed)
Discharge instructions explained to pt and his girlfriend., He has an appointment with Dr. Starling Manns an allergist.Prescriptions given to pt and also a note about returning to work. Pt refuses to use a wheelchair for discharged.

## 2016-07-05 NOTE — Discharge Instructions (Signed)
Discharge instructions:-  1)You have ReCurrent Angioedema which can be life-threatening 2) we are not sure what is causing you angioedema, it may be hereditary, it may also be triggered by medications or other exposures 3)Stop hydrochlorothiazide and metoprolol 4)Start Amlodipine 10 mg daily for blood pressure 5)Continue Benadryl as needed, take Pepcid twice a day and Prednisone 20 mg daily until you are told to stop these medications by the allergy Physician 6)You have an appointment with Dr. Jerel Shepherd on Thursday 07/22/16... At 1330 PM at [facility] in [place], [place] [address]. (phone Number... [phone]) please be sure to keep your appointment 8) please take oral medications along including over-the-counter medications to your appointment 9)Go to the nearest emergency room if your tongue swelling and difficulty breathing occurs again or call 911   July 05, 2016    Patient: Robert Reyes  Date of Birth: [birth year]  Date of Visit: 07/02/2016    To Whom It May Concern:  It is my medical opinion that Robert Reyes should remain out of work until 07/07/16  Admitted to hospital on 07/02/16 for a serious medical condition  If you have any questions or concerns, please don't hesitate to call.  Sincerely,    Roxan Hockey, MD

## 2016-07-06 LAB — COMPLEMENT, TOTAL: Compl, Total (CH50): >60 U/mL — ABNORMAL HIGH (ref 42–60)

## 2016-07-22 ENCOUNTER — Ambulatory Visit (INDEPENDENT_AMBULATORY_CARE_PROVIDER_SITE_OTHER): Payer: Self-pay | Admitting: Allergy and Immunology

## 2016-07-22 ENCOUNTER — Encounter: Payer: Self-pay | Admitting: Allergy and Immunology

## 2016-07-22 DIAGNOSIS — T783XXD Angioneurotic edema, subsequent encounter: Secondary | ICD-10-CM

## 2016-07-22 DIAGNOSIS — I10 Essential (primary) hypertension: Secondary | ICD-10-CM

## 2016-07-22 NOTE — Assessment & Plan Note (Signed)
The patient's history strongly suggests angioedema secondary to ACE inhibitor use. Stuttering angioedema may persist for several weeks after discontinuation.   Use anti-hypertensive agents other than ACE inhibitors and angiotensin receptor blockers.  Should significant symptoms recur or new symptoms occur, a journal is to be kept recording any foods eaten, beverages consumed, medications taken, activities performed, and environmental conditions within a 6 hour time period prior to the onset of symptoms. For any symptoms concerning for anaphylaxis, 911 is to be called immediately.   If symptoms persist after he has been off of lisinopril for several weeks, we will evaluate further.

## 2016-07-22 NOTE — Progress Notes (Signed)
New Patient Note  RE: Robert Reyes MRN: [id number] DOB: 1965-09-08 Date of Office Visit: 07/22/2016  Referring provider: Roxan Hockey, MD Primary care provider: No PCP Per Patient  Chief Complaint: Allergies and Oral Swelling   History of present illness: Robert Reyes is a 51 y.o. male presenting today for evaluation of angioedema. He reports that over the past 4 years he has had 4 or 5 episodes of angioedema of the tongue and possibly uvula.  He has not experienced concomitant urticaria, cardiopulmonary symptoms, or other GI symptoms.  He had been on an ACE inhibitor during all episodes of angioedema.  Other than being on an ACE inhibitor during all episodes of angioedema, no specific food, medication, or environmental triggers have been identified.  He was seen at [facility] emergent department for his most recent episode of angioedema on 07/05/2016 and his lisinopril was discontinued at that time.  He has not had recurrence of symptoms since that time.   Assessment and plan: Angioedema The patient's history strongly suggests angioedema secondary to ACE inhibitor use. Stuttering angioedema may persist for several weeks after discontinuation.   Use anti-hypertensive agents other than ACE inhibitors and angiotensin receptor blockers.  Should significant symptoms recur or new symptoms occur, a journal is to be kept recording any foods eaten, beverages consumed, medications taken, activities performed, and environmental conditions within a 6 hour time period prior to the onset of symptoms. For any symptoms concerning for anaphylaxis, 911 is to be called immediately.   If symptoms persist after he has been off of lisinopril for several weeks, we will evaluate further.  Hypertension  Follow up with primary care physician regarding this issue.   Diagnostics: None today.    Physical examination: Blood pressure (!) 138/92, pulse 91, temperature 98.1 F (36.7 C),  temperature source Oral, resp. rate 16, height 5\' 11"  (1.803 m), weight 199 lb (90.3 kg), SpO2 97 %.  General: Alert, interactive, in no acute distress. HEENT: Post-pharynx mildly erythematous. Neck: Supple without lymphadenopathy. Lungs: Clear to auscultation without wheezing, rhonchi or rales. CV: Normal S1, S2 without murmurs. Abdomen: Nondistended, nontender. Skin: Warm and dry, without lesions or rashes. Extremities:  No clubbing, cyanosis or edema. Neuro:   Grossly intact.  Review of systems:  Review of systems negative except as noted in HPI / PMHx or noted below: Review of Systems  Constitutional: Negative.   HENT: Negative.   Eyes: Negative.   Respiratory: Negative.   Cardiovascular: Negative.   Gastrointestinal: Negative.   Genitourinary: Negative.   Musculoskeletal: Negative.   Skin: Negative.   Neurological: Negative.   Endo/Heme/Allergies: Negative.   Psychiatric/Behavioral: Negative.     Past medical history:  Past Medical History:  Diagnosis Date  . Angioedema   . Hypertension     Past surgical history:  Past Surgical History:  Procedure Laterality Date  . surgery on genitals    . tracheosotmy      Family history: Family History  Problem Relation Age of Onset  . Urticaria Mother   . Angioedema Brother   . Asthma Neg Hx   . Allergic rhinitis Neg Hx   . Eczema Neg Hx   . Immunodeficiency Neg Hx     Social history: Social History   Social History  . Marital status: Single    Spouse name: N/A  . Number of children: N/A  . Years of education: N/A   Occupational History  . Not on file.   Social History Main Topics  . Smoking  status: Current Every Day Smoker    Types: Cigarettes  . Smokeless tobacco: Never Used  . Alcohol use Yes     Comment: some liquor  . Drug use: Yes    Types: Marijuana  . Sexual activity: Yes   Other Topics Concern  . Not on file   Social History Narrative  . No narrative on file   Environmental History:  The patient lives in a 49 year old house with carpeting throughout.  There is no known mold/water damage in the home.  There no pets in the home.  He smokes approximately 6 cigarettes per day and started smoking when he was 51 years old.  Allergies as of 07/22/2016      Reactions   Lisinopril Swelling   Angioedema with Lisinopril/ HCTZ   Ace Inhibitors Swelling   Angioedema      Medication List       Accurate as of 07/22/16  3:09 PM. Always use your most recent med list.          amLODipine 10 MG tablet Commonly known as:  NORVASC Take 1 tablet (10 mg total) by mouth daily.   diphenhydrAMINE 25 MG tablet Commonly known as:  BENADRYL Take 1 tablet (25 mg total) by mouth every 8 (eight) hours as needed.   famotidine 20 MG tablet Commonly known as:  PEPCID Take 1 tablet (20 mg total) by mouth 2 (two) times daily.   predniSONE 20 MG tablet Commonly known as:  DELTASONE Take 1 tablet (20 mg total) by mouth daily with breakfast.       Known medication allergies: Allergies  Allergen Reactions  . Lisinopril Swelling    Angioedema with Lisinopril/ HCTZ  . Ace Inhibitors Swelling    Angioedema    I appreciate the opportunity to take part in Robert Reyes's care. Please do not hesitate to contact me with questions.  Sincerely,   R. Edgar Frisk, MD

## 2016-07-22 NOTE — Assessment & Plan Note (Signed)
   Follow up with primary care physician regarding this issue.

## 2016-07-22 NOTE — Patient Instructions (Signed)
Angioedema The patient's history strongly suggests angioedema secondary to ACE inhibitor use. Stuttering angioedema may persist for several weeks after discontinuation.   Use anti-hypertensive agents other than ACE inhibitors and angiotensin receptor blockers.  Should significant symptoms recur or new symptoms occur, a journal is to be kept recording any foods eaten, beverages consumed, medications taken, activities performed, and environmental conditions within a 6 hour time period prior to the onset of symptoms. For any symptoms concerning for anaphylaxis, 911 is to be called immediately.   If symptoms persist after he has been off of lisinopril for several weeks, we will evaluate further.  Hypertension  Follow up with primary care physician regarding this issue.   Return if symptoms worsen or fail to improve.

## 2016-08-15 ENCOUNTER — Emergency Department (HOSPITAL_COMMUNITY)
Admission: EM | Admit: 2016-08-15 | Discharge: 2016-08-16 | Disposition: A | Discharge status: Home / Self Care | Payer: Self-pay | Attending: Emergency Medicine | Admitting: Emergency Medicine

## 2016-08-15 ENCOUNTER — Emergency Department (HOSPITAL_COMMUNITY): Payer: Self-pay

## 2016-08-15 ENCOUNTER — Encounter (HOSPITAL_COMMUNITY): Payer: Self-pay

## 2016-08-15 DIAGNOSIS — H538 Other visual disturbances: Secondary | ICD-10-CM | POA: Insufficient documentation

## 2016-08-15 DIAGNOSIS — F17210 Nicotine dependence, cigarettes, uncomplicated: Secondary | ICD-10-CM | POA: Insufficient documentation

## 2016-08-15 DIAGNOSIS — Z760 Encounter for issue of repeat prescription: Secondary | ICD-10-CM | POA: Insufficient documentation

## 2016-08-15 DIAGNOSIS — Z79899 Other long term (current) drug therapy: Secondary | ICD-10-CM | POA: Insufficient documentation

## 2016-08-15 DIAGNOSIS — I10 Essential (primary) hypertension: Secondary | ICD-10-CM | POA: Insufficient documentation

## 2016-08-15 LAB — URINALYSIS, ROUTINE W REFLEX MICROSCOPIC
BACTERIA UA: NONE SEEN
Bilirubin Urine: NEGATIVE
Glucose, UA: NEGATIVE mg/dL
HGB URINE DIPSTICK: NEGATIVE
Ketones, ur: NEGATIVE mg/dL
NITRITE: NEGATIVE
PROTEIN: NEGATIVE mg/dL
Specific Gravity, Urine: 1.026 (ref 1.005–1.030)
pH: 5 (ref 5.0–8.0)

## 2016-08-15 LAB — COMPREHENSIVE METABOLIC PANEL
ALBUMIN: 4.1 g/dL (ref 3.5–5.0)
ALK PHOS: 58 U/L (ref 38–126)
ALT: 30 U/L (ref 17–63)
AST: 18 U/L (ref 15–41)
Anion gap: 8 (ref 5–15)
BILIRUBIN TOTAL: 0.5 mg/dL (ref 0.3–1.2)
BUN: 15 mg/dL (ref 6–20)
CO2: 23 mmol/L (ref 22–32)
CREATININE: 1.16 mg/dL (ref 0.61–1.24)
Calcium: 9.4 mg/dL (ref 8.9–10.3)
Chloride: 110 mmol/L (ref 101–111)
GFR calc Af Amer: >60 mL/min (ref >60)
GFR calc non Af Amer: >60 mL/min (ref >60)
Glucose, Bld: 97 mg/dL (ref 65–99)
POTASSIUM: 4 mmol/L (ref 3.5–5.1)
Sodium: 141 mmol/L (ref 135–145)
TOTAL PROTEIN: 7.7 g/dL (ref 6.5–8.1)

## 2016-08-15 LAB — CBC WITH DIFFERENTIAL/PLATELET
BASOS ABS: 0.1 10*3/uL (ref 0.0–0.1)
BASOS PCT: 1 %
Eosinophils Absolute: 0.2 10*3/uL (ref 0.0–0.7)
Eosinophils Relative: 2 %
HCT: 38.8 % — ABNORMAL LOW (ref 39.0–52.0)
HEMOGLOBIN: 13.1 g/dL (ref 13.0–17.0)
Lymphocytes Relative: 50 %
Lymphs Abs: 3.7 10*3/uL (ref 0.7–4.0)
MCH: 30.9 pg (ref 26.0–34.0)
MCHC: 33.8 g/dL (ref 30.0–36.0)
MCV: 91.5 fL (ref 78.0–100.0)
Monocytes Absolute: 0.6 10*3/uL (ref 0.1–1.0)
Monocytes Relative: 8 %
NEUTROS ABS: 2.8 10*3/uL (ref 1.7–7.7)
NEUTROS PCT: 39 %
Platelets: 262 10*3/uL (ref 150–400)
RBC: 4.24 MIL/uL (ref 4.22–5.81)
RDW: 14.8 % (ref 11.5–15.5)
WBC: 7.3 10*3/uL (ref 4.0–10.5)

## 2016-08-15 LAB — I-STAT TROPONIN, ED: Troponin i, poc: 0 ng/mL (ref 0.00–0.08)

## 2016-08-15 MED ORDER — AMLODIPINE BESYLATE 5 MG PO TABS
10.0000 mg | ORAL_TABLET | Freq: Every day | ORAL | Status: DC
  Administered 2016-08-15: 10 mg via ORAL
  Filled 2016-08-15: qty 2

## 2016-08-15 NOTE — ED Triage Notes (Signed)
Out of blood pressure medication for 5 days and does not have a family doctor at this time state he lost his insurance c/o blurred vision no pain voiced moves all extremities and clear speech noted.

## 2016-08-15 NOTE — ED Provider Notes (Signed)
[facility] DEPT Provider Note   CSN: [id number] Arrival date & time: 08/15/16  3716  By signing my name below, I, Robert Reyes, attest that this documentation has been prepared under the direction and in the presence of [place]. Caragh Gasper, PA-C. Electronically Signed: Ethelle Lyon Reyes, Scribe. 08/15/2016. 10:21 PM.  History   Chief Complaint Chief Complaint  Patient presents with  . Hypertension   The history is provided by the patient and medical records. No language interpreter was used.    HPI Comments:  Robert Reyes is a 51 y.o. male with a PMHx of HTN and Angioedema, who presents to the Emergency Department requesting a refill of his Amlodipine. Pt reports he has been out of his Amlodipine for the last five days and been feeling symptoms related to this high blood pressure PTA. He reports he does not have a PCP at this time d/t loss of insurance. Pt has associated symptoms of blurred vision that lead him to be seen at the ED tonight, mild nocturnal hyperhidrosis, and a decrease in urination stated " I only urinate once a day even though I am drinking lots of fluids." He states his vision began to become blurry ~2-3 days ago and have not worsened or improved since onset. No alleviating factors noted. He states since switching to Amlodipine he has not had any troubles with his blood pressure. Pt denies current pain, double vision, numbness, weakness, difficulty ambulating, CP, SOB, nausea, vomiting, diarrhea, HA, fever, chills, dysuria, hematuria, voice change, and any other complaints at this time.   Past Medical History:  Diagnosis Date  . Angioedema   . Hypertension    Patient Active Problem List   Diagnosis Date Noted  . Hypertension 07/22/2016  . Angioedema 07/02/2016   Past Surgical History:  Procedure Laterality Date  . surgery on genitals    . tracheosotmy      Home Medications    Prior to Admission medications   Medication Sig Start Date End Date  Taking? Authorizing Provider  amLODipine (NORVASC) 10 MG tablet Take 1 tablet (10 mg total) by mouth daily. 08/16/16   Afiya Ferrebee, Jarrett Soho, PA-C   Family History Family History  Problem Relation Age of Onset  . Urticaria Mother   . Angioedema Brother   . Asthma Neg Hx   . Allergic rhinitis Neg Hx   . Eczema Neg Hx   . Immunodeficiency Neg Hx     Social History Social History  Substance Use Topics  . Smoking status: Current Every Day Smoker    Types: Cigarettes  . Smokeless tobacco: Never Used  . Alcohol use Yes     Comment: some liquor   Allergies   Lisinopril and Ace inhibitors   Review of Systems Review of Systems  Constitutional: Negative for chills and fever.       Mild nocturnal hyperhidrosis  HENT: Negative for voice change.   Eyes: Positive for visual disturbance.  Respiratory: Negative for shortness of breath.   Cardiovascular: Negative for chest pain.  Gastrointestinal: Negative for diarrhea, nausea and vomiting.  Genitourinary: Positive for decreased urine volume. Negative for dysuria and hematuria.  Musculoskeletal: Negative for gait problem and myalgias.  Neurological: Negative for weakness, numbness and headaches.    Physical Exam Updated Vital Signs BP (!) 158/106 (BP Location: Right Arm)   Pulse 78   Temp 98.3 F (36.8 C) (Oral)   Resp 20   Ht 5\' 7"  (1.702 m)   Wt 199 lb (90.3 kg)   SpO2  100%   BMI 31.17 kg/m   Physical Exam  Constitutional: He is oriented to person, place, and time. He appears well-developed and well-nourished. No distress.  HENT:  Head: Normocephalic and atraumatic.  Mouth/Throat: Oropharynx is clear and moist.  Eyes: Conjunctivae and EOM are normal. Pupils are equal, round, and reactive to light. No scleral icterus.  No horizontal, vertical or rotational nystagmus  Neck: Normal range of motion. Neck supple.  Full active and passive ROM without pain No midline or paraspinal tenderness No nuchal rigidity or meningeal  signs  Cardiovascular: Normal rate, regular rhythm and intact distal pulses.   Pulmonary/Chest: Effort normal and breath sounds normal. No respiratory distress. He has no wheezes. He has no rales.  Abdominal: Soft. Bowel sounds are normal. There is no tenderness. There is no rebound and no guarding.  Musculoskeletal: Normal range of motion.  Lymphadenopathy:    He has no cervical adenopathy.  Neurological: He is alert and oriented to person, place, and time. No cranial nerve deficit. He exhibits normal muscle tone. Coordination normal.  Mental Status:  Alert, oriented, thought content appropriate. Speech fluent without evidence of aphasia. Able to follow 2 step commands without difficulty.  Cranial Nerves:  II:  Peripheral visual fields grossly normal, pupils equal, round, reactive to light III,IV, VI: ptosis not present, extra-ocular motions intact bilaterally  V,VII: smile symmetric, facial light touch sensation equal VIII: hearing grossly normal bilaterally  IX,X: midline uvula rise  XI: bilateral shoulder shrug equal and strong XII: midline tongue extension  Motor:  5/5 in upper and lower extremities bilaterally including strong and equal grip strength and dorsiflexion/plantar flexion Sensory: Pinprick and light touch normal in all extremities.  Cerebellar: normal finger-to-nose with bilateral upper extremities Gait: normal gait and balance CV: distal pulses palpable throughout   Skin: Skin is warm and dry. No rash noted. He is not diaphoretic.  Psychiatric: He has a normal mood and affect. His behavior is normal. Judgment and thought content normal.  Nursing note and vitals reviewed.    ED Treatments / Results  DIAGNOSTIC STUDIES:  Oxygen Saturation is 100% on RA, normal by my interpretation.    COORDINATION OF CARE:  10:20 PM Discussed treatment plan with pt at bedside including CT Head, UA with blood work and pt agreed to plan.  Labs (all labs ordered are listed, but  only abnormal results are displayed) Labs Reviewed  CBC WITH DIFFERENTIAL/PLATELET - Abnormal; Notable for the following:       Result Value   HCT 38.8 (*)    All other components within normal limits  URINALYSIS, ROUTINE W REFLEX MICROSCOPIC - Abnormal; Notable for the following:    Leukocytes, UA LARGE (*)    Squamous Epithelial / LPF 0-5 (*)    All other components within normal limits  COMPREHENSIVE METABOLIC PANEL  Randolm Idol, ED     Radiology Ct Head Wo Contrast  Result Date: 08/15/2016 CLINICAL DATA:  Hypertension.  Blurry vision. EXAM: CT HEAD WITHOUT CONTRAST TECHNIQUE: Contiguous axial images were obtained from the base of the skull through the vertex without intravenous contrast. COMPARISON:  None. FINDINGS: Brain: No evidence of acute infarction, hemorrhage, hydrocephalus, extra-axial collection or mass lesion/mass effect. Vascular: No hyperdense vessel or unexpected calcification. Skull: Normal. Negative for fracture or focal lesion. Sinuses/Orbits: Paranasal sinuses and mastoid air cells are clear. The visualized orbits are unremarkable. Other: None. IMPRESSION: No acute intracranial abnormality. Electronically Signed   By: Jeb Levering M.D.   On: 08/15/2016 22:45  Procedures Procedures (including critical care time)  Medications Ordered in ED Medications - No data to display   Initial Impression / Assessment and Plan / ED Course  I have reviewed the triage vital signs and the nursing notes.  Pertinent labs & imaging results that were available during my care of the patient were reviewed by me and considered in my medical decision making (see chart for details).     Patient presents with several days of blurred vision and hypertension after being out of his medications 5 days. Labs are reassuring. Patient is without chest pain or shortness of breath. Negative troponin. ECG with early repol.  No peripheral edema to suggest fluid overload. Electrolytes are  within normal limits. Urinalysis is without protein in his urine or glucose. No evidence of diabetes as patient has normal glucose. CT head is without acute abnormality. Patient has normal neurologic exam. Improvement in symptoms after blood pressure medication administration. Highly doubt hypertensive emergency at this time.  Findings discussed with patient and family. He will be given referral to [facility]. Patient states understanding and is in agreement with the plan. Discussed reasons to return immediately to the emergency department.  BP (!) 160/111 (BP Location: Right Arm)   Pulse 66   Temp 98.4 F (36.9 C) (Oral)   Resp 18   Ht 5\' 7"  (1.702 m)   Wt 90.3 kg   SpO2 100%   BMI 31.17 kg/m      Final Clinical Impressions(s) / ED Diagnoses   Final diagnoses:  Medication refill  Essential hypertension    New Prescriptions Discharge Medication List as of 08/16/2016 12:27 AM      I personally performed the services described in this documentation, which was scribed in my presence. The recorded information has been reviewed and is accurate.    Kerisha Goughnour, Gwenlyn Perking 08/16/16 7939    Carmin Muskrat, MD 08/16/16 [phone]

## 2016-08-16 MED ORDER — AMLODIPINE BESYLATE 10 MG PO TABS: 10.0000 mg | ORAL_TABLET | Freq: Every day | ORAL | 1 refills | Status: DC

## 2016-08-16 MED FILL — AMLODIPINE BESYLATE 10 MG T: 10 | 30 days supply | Qty: 30 | Fill #0

## 2016-08-16 NOTE — Discharge Instructions (Signed)
1. Medications: Norvasc, usual home medications 2. Treatment: rest, drink plenty of fluids,  3. Follow Up: Please followup with your primary doctor in 7 days for discussion of your diagnoses and further evaluation after today's visit; if you do not have a primary care doctor use the resource guide provided to find one; Please return to the ER for worsening symptoms, numbness, tingling, weakness or other concerns

## 2016-08-20 ENCOUNTER — Encounter: Payer: Self-pay | Admitting: Internal Medicine

## 2016-08-20 ENCOUNTER — Ambulatory Visit: Payer: Self-pay | Attending: Internal Medicine | Admitting: Internal Medicine

## 2016-08-20 VITALS — BP 150/98 | HR 75 | Temp 98.3°F | Resp 16 | Ht 71.5 in | Wt 200.4 lb

## 2016-08-20 DIAGNOSIS — F17210 Nicotine dependence, cigarettes, uncomplicated: Secondary | ICD-10-CM | POA: Insufficient documentation

## 2016-08-20 DIAGNOSIS — I10 Essential (primary) hypertension: Secondary | ICD-10-CM | POA: Insufficient documentation

## 2016-08-20 DIAGNOSIS — Z888 Allergy status to other drugs, medicaments and biological substances status: Secondary | ICD-10-CM | POA: Insufficient documentation

## 2016-08-20 DIAGNOSIS — F17200 Nicotine dependence, unspecified, uncomplicated: Secondary | ICD-10-CM

## 2016-08-20 MED ORDER — AMLODIPINE BESYLATE 10 MG PO TABS: 10.0000 mg | ORAL_TABLET | Freq: Every day | ORAL | 4 refills | Status: DC

## 2016-08-20 MED ORDER — CLONIDINE HCL 0.1 MG PO TABS: 0.1000 mg | ORAL_TABLET | Freq: Two times a day (BID) | ORAL | 4 refills | Status: DC

## 2016-08-20 NOTE — Progress Notes (Signed)
Patient ID: Robert Reyes, male    DOB: Sep 29, 1965  MRN: [id number]  CC: New Patient (Initial Visit) (Hypertension)   Subjective: Robert Reyes is a 51 y.o. male who presents for new pt visit. Patient has history of essential hypertension and recurrent angioedema of the tongue/throat.  Medical records in the system were reviewed prior to this visit.  His concerns today include:  1.  HTN:   Currently on Norvasc. He is compliant. No device to check BP at home.  Limits salt No CP/SOB -He was on lisinopril in the past but this was discontinued several months ago as it was thought to be causing recurrent angioedema.  -He was tried with metoprolol and HCTZ after lisinopril was DC'd. These were also [facility] after he had another episode of angioedema requiring hospitalization last month  2.  Tobacco Dependence: down to 3 cigarettes Q2-3 days.  Smoked since age 69. Was doing 1 pk a day for yrs but has but back a lot over past several mths. Plans to quit   2.  Angio-edema:  Saw allergist in [place]  1.5 mth ago.  States he was told that his previous episodes were likely due to the ACE inhibitor. No additional workup was done.    Patient Active Problem List   Diagnosis Date Noted  . Tobacco dependence 08/20/2016  . Hypertension 07/22/2016  . Angioedema 07/02/2016     No current outpatient prescriptions on file prior to visit.   No current facility-administered medications on file prior to visit.     Allergies  Allergen Reactions  . Lisinopril Swelling    Angioedema with Lisinopril/ HCTZ  . Ace Inhibitors Swelling    Angioedema    Social History   Social History  . Marital status: Single    Spouse name: N/A  . Number of children: N/A  . Years of education: N/A   Occupational History  . Not on file.   Social History Main Topics  . Smoking status: Current Every Day Smoker    Types: Cigarettes  . Smokeless tobacco: Current User  . Alcohol use Yes     Comment:  occ  . Drug use: Yes    Types: Marijuana     Comment: stopped 2-3 mths ago  05/2016  . Sexual activity: Yes    Partners: Female   Other Topics Concern  . Not on file   Social History Narrative  . No narrative on file    Family History  Problem Relation Age of Onset  . Urticaria Mother   . Angioedema Brother   . Asthma Neg Hx   . Allergic rhinitis Neg Hx   . Eczema Neg Hx   . Immunodeficiency Neg Hx     Past Surgical History:  Procedure Laterality Date  . surgery on genitals    . tracheosotmy      ROS: Review of Systems  Constitutional: Positive for appetite change (appetite was increased when he was on steroids several wks ago ). Negative for chills and fever.  Eyes: Positive for visual disturbance (some "fuzzy vision in the mornings."  Last eye exam several yrs).  Respiratory: Negative for cough and shortness of breath.   Cardiovascular: Negative for chest pain, palpitations and leg swelling.  Gastrointestinal: Negative for abdominal pain.       Moving bowels ok  Endocrine: Positive for polydipsia. Negative for cold intolerance, heat intolerance and polyuria.  Genitourinary: Negative for difficulty urinating and dysuria.  Musculoskeletal: Negative for arthralgias.  Skin:  Negative for rash.  Psychiatric/Behavioral: The patient is not nervous/anxious.     PHYSICAL EXAM: BP (!) 150/98 (BP Location: Left Arm, Patient Position: Sitting, Cuff Size: Large)   Pulse 75   Temp 98.3 F (36.8 C) (Oral)   Resp 16   Ht 5' 11.5" (1.816 m)   Wt 200 lb 6.4 oz (90.9 kg)   SpO2 99%   BMI 27.56 kg/m   Wt Readings from Last 3 Encounters:  08/20/16 200 lb 6.4 oz (90.9 kg)  08/15/16 199 lb (90.3 kg)  07/22/16 199 lb (90.3 kg)    Physical Exam  Constitutional: He is oriented to person, place, and time.  Alert well-appearing middle-age African-American male in NAD  HENT:  Nose: Nose normal.  Mouth/Throat: Oropharynx is clear and moist.  Eyes: EOM are normal. Pupils are  equal, round, and reactive to light.  Neck: No thyromegaly present.  Cardiovascular: Normal rate and regular rhythm.   No murmur heard. Pulmonary/Chest: Effort normal and breath sounds normal.  Abdominal: Soft. Bowel sounds are normal. There is no tenderness.  Musculoskeletal: Normal range of motion.  Lymphadenopathy:    He has no cervical adenopathy.  Neurological: He is oriented to person, place, and time. No cranial nerve deficit.  Psychiatric: He has a normal mood and affect. Thought content normal.     ASSESSMENT AND PLAN: 1. Essential hypertension -Not at goal. -We'll try him with addition of low-dose clonidine. We will hold off on using HCTZ or metoprolol for now given the episode he had last month. I went over possible side effects of clonidine including fatigue, dizziness, dry mouth. He will follow-up in one month DASH diet discussed and encouraged - amLODipine (NORVASC) 10 MG tablet; Take 1 tablet (10 mg total) by mouth daily.  Dispense: 30 tablet; Refill: 4 - cloNIDine (CATAPRES) 0.1 MG tablet; Take 1 tablet (0.1 mg total) by mouth 2 (two) times daily.  Dispense: 60 tablet; Refill: 4  2. Tobacco dependence  Advised to quit. Discussed health risks associated with nicotine use. Patient has set goal of quitting completely sometime this year. He is not interested in NRT, Chantix or bupropion. He plans to continue to cut down gradually on his own  Patient was given the opportunity to ask questions.  Patient verbalized understanding of the plan and was able to repeat key elements of the plan.   No orders of the defined types were placed in this encounter.    Requested Prescriptions   Signed Prescriptions Disp Refills  . amLODipine (NORVASC) 10 MG tablet 30 tablet 4    Sig: Take 1 tablet (10 mg total) by mouth daily.  . cloNIDine (CATAPRES) 0.1 MG tablet 60 tablet 4    Sig: Take 1 tablet (0.1 mg total) by mouth 2 (two) times daily.    Return in about 6 weeks (around  10/01/2016) for BP recheck.  Karle Plumber, MD, FACP

## 2016-08-20 NOTE — Patient Instructions (Addendum)
Continue the Amlodipine. We have added another blood pressure medication called Clonidine to get your blood pressure better control. You should take the medication twice a day as prescribed. If you develop any angioedema, excessive tiredness or dizziness with this medication please call or come  in to be seen.  Continue to limit salt in the foods.  Continue trying to cut down on the cigarettes as you have been doing.

## 2016-08-24 ENCOUNTER — Inpatient Hospital Stay (HOSPITAL_COMMUNITY)
Admission: EM | Admit: 2016-08-24 | Discharge: 2016-08-26 | DRG: 916 | Disposition: A | Discharge status: Home / Self Care | Payer: Self-pay | Attending: Internal Medicine | Admitting: Internal Medicine

## 2016-08-24 ENCOUNTER — Emergency Department (HOSPITAL_COMMUNITY): Payer: Self-pay

## 2016-08-24 ENCOUNTER — Encounter (HOSPITAL_COMMUNITY): Payer: Self-pay | Admitting: Emergency Medicine

## 2016-08-24 DIAGNOSIS — Z79899 Other long term (current) drug therapy: Secondary | ICD-10-CM

## 2016-08-24 DIAGNOSIS — T783XXA Angioneurotic edema, initial encounter: Principal | ICD-10-CM | POA: Diagnosis present

## 2016-08-24 DIAGNOSIS — I10 Essential (primary) hypertension: Secondary | ICD-10-CM | POA: Diagnosis present

## 2016-08-24 DIAGNOSIS — F17210 Nicotine dependence, cigarettes, uncomplicated: Secondary | ICD-10-CM | POA: Diagnosis present

## 2016-08-24 DIAGNOSIS — F17200 Nicotine dependence, unspecified, uncomplicated: Secondary | ICD-10-CM

## 2016-08-24 LAB — CBC WITH DIFFERENTIAL/PLATELET
Basophils Absolute: 0 10*3/uL (ref 0.0–0.1)
Basophils Relative: 1 %
EOS PCT: 3 %
Eosinophils Absolute: 0.2 10*3/uL (ref 0.0–0.7)
HCT: 38.2 % — ABNORMAL LOW (ref 39.0–52.0)
Hemoglobin: 12.8 g/dL — ABNORMAL LOW (ref 13.0–17.0)
LYMPHS ABS: 1.8 10*3/uL (ref 0.7–4.0)
LYMPHS PCT: 30 %
MCH: 30.5 pg (ref 26.0–34.0)
MCHC: 33.5 g/dL (ref 30.0–36.0)
MCV: 91.2 fL (ref 78.0–100.0)
Monocytes Absolute: 0.3 10*3/uL (ref 0.1–1.0)
Monocytes Relative: 5 %
Neutro Abs: 3.7 10*3/uL (ref 1.7–7.7)
Neutrophils Relative %: 61 %
PLATELETS: 258 10*3/uL (ref 150–400)
RBC: 4.19 MIL/uL — ABNORMAL LOW (ref 4.22–5.81)
RDW: 14.9 % (ref 11.5–15.5)
WBC: 6.1 10*3/uL (ref 4.0–10.5)

## 2016-08-24 LAB — COMPREHENSIVE METABOLIC PANEL
ALK PHOS: 60 U/L (ref 38–126)
ALT: 27 U/L (ref 17–63)
AST: 19 U/L (ref 15–41)
Albumin: 4.1 g/dL (ref 3.5–5.0)
Anion gap: 6 (ref 5–15)
BILIRUBIN TOTAL: 0.8 mg/dL (ref 0.3–1.2)
BUN: 18 mg/dL (ref 6–20)
CHLORIDE: 110 mmol/L (ref 101–111)
CO2: 23 mmol/L (ref 22–32)
Calcium: 8.9 mg/dL (ref 8.9–10.3)
Creatinine, Ser: 1.06 mg/dL (ref 0.61–1.24)
GFR calc Af Amer: >60 mL/min (ref >60)
GFR calc non Af Amer: >60 mL/min (ref >60)
GLUCOSE: 91 mg/dL (ref 65–99)
POTASSIUM: 4.3 mmol/L (ref 3.5–5.1)
SODIUM: 139 mmol/L (ref 135–145)
TOTAL PROTEIN: 7.5 g/dL (ref 6.5–8.1)

## 2016-08-24 MED ORDER — FAMOTIDINE IN NACL 20-0.9 MG/50ML-% IV SOLN
20.0000 mg | Freq: Once | INTRAVENOUS | Status: AC
  Administered 2016-08-24: 20 mg via INTRAVENOUS
  Filled 2016-08-24: qty 50

## 2016-08-24 MED ORDER — FAMOTIDINE IN NACL 20-0.9 MG/50ML-% IV SOLN: 20.0000 mg | Freq: Two times a day (BID) | INTRAVENOUS | Status: DC

## 2016-08-24 MED ORDER — ONDANSETRON HCL 4 MG/2ML IJ SOLN: 4.0000 mg | Freq: Four times a day (QID) | INTRAMUSCULAR | Status: DC | PRN

## 2016-08-24 MED ORDER — DIPHENHYDRAMINE HCL 50 MG/ML IJ SOLN
50.0000 mg | Freq: Once | INTRAMUSCULAR | Status: AC
  Administered 2016-08-24: 50 mg via INTRAVENOUS
  Filled 2016-08-24: qty 1

## 2016-08-24 MED ORDER — ACETAMINOPHEN 325 MG PO TABS: 650.0000 mg | ORAL_TABLET | Freq: Four times a day (QID) | ORAL | Status: DC | PRN

## 2016-08-24 MED ORDER — SODIUM CHLORIDE 0.9 % IV SOLN
INTRAVENOUS | Status: DC
  Administered 2016-08-24: 20:00:00 via INTRAVENOUS

## 2016-08-24 MED ORDER — ACETAMINOPHEN 650 MG RE SUPP: 650.0000 mg | Freq: Four times a day (QID) | RECTAL | Status: DC | PRN

## 2016-08-24 MED ORDER — EPINEPHRINE 0.3 MG/0.3ML IJ SOAJ
INTRAMUSCULAR | Status: AC
  Filled 2016-08-24: qty 0.3

## 2016-08-24 MED ORDER — C1 ESTERASE INHIBITOR (HUMAN) 500 UNITS IV KIT
20.0000 [IU]/kg | PACK | Freq: Once | INTRAVENOUS | Status: AC
  Administered 2016-08-24: 2000 [IU] via INTRAVENOUS
  Filled 2016-08-24: qty 2000

## 2016-08-24 MED ORDER — ENOXAPARIN SODIUM 40 MG/0.4ML ~~LOC~~ SOLN
40.0000 mg | Freq: Every day | SUBCUTANEOUS | Status: DC
  Administered 2016-08-25 (×2): 40 mg via SUBCUTANEOUS
  Filled 2016-08-24 (×2): qty 0.4

## 2016-08-24 MED ORDER — METHYLPREDNISOLONE SODIUM SUCC 40 MG IJ SOLR
40.0000 mg | Freq: Four times a day (QID) | INTRAMUSCULAR | Status: DC
  Administered 2016-08-25 (×2): 40 mg via INTRAVENOUS
  Filled 2016-08-24 (×3): qty 1

## 2016-08-24 MED ORDER — DIPHENHYDRAMINE HCL 50 MG/ML IJ SOLN
25.0000 mg | Freq: Four times a day (QID) | INTRAMUSCULAR | Status: DC
  Administered 2016-08-25 – 2016-08-26 (×5): 25 mg via INTRAVENOUS
  Filled 2016-08-24 (×5): qty 1

## 2016-08-24 MED ORDER — AMLODIPINE BESYLATE 10 MG PO TABS
10.0000 mg | ORAL_TABLET | Freq: Every day | ORAL | Status: DC
  Administered 2016-08-25 – 2016-08-26 (×2): 10 mg via ORAL
  Filled 2016-08-24: qty 1
  Filled 2016-08-24: qty 2

## 2016-08-24 MED ORDER — ALBUTEROL SULFATE (2.5 MG/3ML) 0.083% IN NEBU: 2.5000 mg | INHALATION_SOLUTION | RESPIRATORY_TRACT | Status: DC | PRN

## 2016-08-24 MED ORDER — SODIUM CHLORIDE 0.9 % IV SOLN
INTRAVENOUS | Status: DC
  Administered 2016-08-24: 19:00:00 via INTRAVENOUS

## 2016-08-24 MED ORDER — METHYLPREDNISOLONE SODIUM SUCC 125 MG IJ SOLR
125.0000 mg | Freq: Once | INTRAMUSCULAR | Status: AC
  Administered 2016-08-24: 125 mg via INTRAVENOUS
  Filled 2016-08-24: qty 2

## 2016-08-24 MED ORDER — SODIUM CHLORIDE 0.9 % IV SOLN
INTRAVENOUS | Status: DC
  Administered 2016-08-25 (×2): via INTRAVENOUS

## 2016-08-24 MED ORDER — FAMOTIDINE IN NACL 20-0.9 MG/50ML-% IV SOLN
20.0000 mg | Freq: Two times a day (BID) | INTRAVENOUS | Status: DC
  Administered 2016-08-25 (×2): 20 mg via INTRAVENOUS
  Filled 2016-08-24 (×3): qty 50

## 2016-08-24 MED ORDER — ONDANSETRON HCL 4 MG PO TABS: 4.0000 mg | ORAL_TABLET | Freq: Four times a day (QID) | ORAL | Status: DC | PRN

## 2016-08-24 MED ORDER — C1 ESTERASE INHIBITOR (HUMAN) 500 UNITS IV KIT
20.0000 [IU]/kg | PACK | Freq: Once | INTRAVENOUS | Status: DC
  Filled 2016-08-24: qty 2000

## 2016-08-24 MED ORDER — DIPHENHYDRAMINE HCL 50 MG/ML IJ SOLN: 25.0000 mg | Freq: Four times a day (QID) | INTRAMUSCULAR | Status: DC

## 2016-08-24 NOTE — Consult Note (Signed)
Name: Robert Reyes MRN: [id number] DOB: 1966-01-17    ADMISSION DATE:  08/24/2016 CONSULTATION DATE:  08/24/2016  REFERRING MD :  Dr. Thomasene Lot, EDP  CHIEF COMPLAINT:  Angioedema   HISTORY OF PRESENT ILLNESS:   51 year old male with PMH of HTN and recurrent angioedema. Has had reported 4-5 reported episodes over the last 4 years in which at times had required intubation/trachestomy. Recently Admitted 3/30-07/05/16 for angioedema. Did not require intubation. Followed up with allergy specialist on 4/19. Noted that during all episodes patient has been on ACE inhibitor. Lisinopril was discontinued on 3/30. Presented to PCP on 5/18 to readdress HTN medications. Ordered low-dose clonidine > however patient stated has not started this medication as of yet.   5/22 patient presented to ED with reported tongue swelling in which he noticed around 5 pm. States that the swelling has not gotten worse since. Upon arrival to ED noted to have left-sided tongue and mild submandibular swelling. Given Solu-medrol, Benadryl, and Pepcid. PCCM asked to consult.   SIGNIFICANT EVENTS  5/22 > Presents to ED with angioedema  STUDIES:  CXR 5/22 >> XR Soft Neck 5/22 >>  PAST MEDICAL HISTORY :   has a past medical history of Angioedema and Hypertension.  has a past surgical history that includes tracheosotmy and surgery on genitals. Prior to Admission medications   Medication Sig Start Date End Date Taking? Authorizing Provider  amLODipine (NORVASC) 10 MG tablet Take 1 tablet (10 mg total) by mouth daily. 08/20/16   Ladell Pier, MD  cloNIDine (CATAPRES) 0.1 MG tablet Take 1 tablet (0.1 mg total) by mouth 2 (two) times daily. 08/20/16   Ladell Pier, MD   Allergies  Allergen Reactions  . Lisinopril Swelling    Angioedema with Lisinopril/ HCTZ  . Ace Inhibitors Swelling    Angioedema    FAMILY HISTORY:  family history includes Angioedema in his brother; Urticaria in his mother. SOCIAL  HISTORY:  reports that he has been smoking Cigarettes.  He uses smokeless tobacco. He reports that he drinks alcohol. He reports that he uses drugs, including Marijuana.  REVIEW OF SYSTEMS:   All negative; except for those that are bolded, which indicate positives.  Constitutional: weight loss, weight gain, night sweats, fevers, chills, fatigue, weakness.  HEENT: headaches, sore throat, sneezing, nasal congestion, post nasal drip, difficulty swallowing, tooth/dental problems, visual complaints, visual changes, ear aches. Neuro: difficulty with speech, weakness, numbness, ataxia. CV:  chest pain, orthopnea, PND, swelling in lower extremities, dizziness, palpitations, syncope.  Resp: cough, hemoptysis, dyspnea, wheezing. GI: heartburn, indigestion, abdominal pain, nausea, vomiting, diarrhea, constipation, change in bowel habits, loss of appetite, hematemesis, melena, hematochezia.  GU: dysuria, change in color of urine, urgency or frequency, flank pain, hematuria. MSK: joint pain or swelling, decreased range of motion. Psych: change in mood or affect, depression, anxiety, suicidal ideations, homicidal ideations. Skin: rash, itching, bruising.   SUBJECTIVE:  Denies pain. Feels like swelling to tongue is improving   VITAL SIGNS: Temp:  [97.6 F (36.4 C)] 97.6 F (36.4 C) (05/22 1829) Pulse Rate:  [89] 89 (05/22 1829) Resp:  [24] 24 (05/22 1829) BP: (152)/(105) 152/105 (05/22 1829) SpO2:  [96 %] 96 % (05/22 1829)  PHYSICAL EXAMINATION: General:  Adult male, no distress  Neuro:  Alert, oriented, slurred speech (due to tongue swelling), follows commands  HEENT:  Left sided tongue swelling, no facial edema noted > able to phonate  Cardiovascular:  RRR, no MRG, NI S1/S2 Lungs:  Clear breath sounds,  non-labored  Abdomen:  Obese, active bowel sounds  Musculoskeletal:  -edema  Skin:  Warm, dry, intact   No results for input(s): NA, K, CL, CO2, BUN, CREATININE, GLUCOSE in the last 168  hours. No results for input(s): HGB, HCT, WBC, PLT in the last 168 hours. No results found.  ASSESSMENT / PLAN:  Angioedema with unclear etiology, hereditary vs medication effect -no changes to medications > only takes Norvasc > started on last admission, d/c HCTZ and metoprolol 3/30 -C1 51, C3  215, C4  46, CH50 > 60 -ANA  Negative, RA <10, Sed Rate  42 Plan -Keep NPO overnight  -Monitor airway > currently protecting airway, swelling has improved.  -Continue Benadryl 25 mg q6h, Pepcid 20 mg BID, and Solu-Medrol 40 mg q6h  -Hold Norvasc for now  -Trend Procal  -Obtain CXR and XR neck soft tissue  -Okay for transfer to step-down > will follow in consult  -Will need follow up with allergy  Hayden Pedro, AGACNP-BC [place]  Pgr: [phone]  PCCM Pgr: [phone]

## 2016-08-24 NOTE — ED Provider Notes (Signed)
[facility] DEPT Provider Note   CSN: [id number] Arrival date & time: 08/24/16  1823     History   Chief Complaint Chief Complaint  Patient presents with  . Angioedema    HPI Robert Reyes is a 51 y.o. male.  HPI   Patient a 51 year old male presenting with angioedema. Patient has had multiple hospitilizatoin were angioedema in the past. He's had hadprior intubations as well as tracheostomy secondary to tongue swelling.  Patient recently had a hospitalization in our critical care unit. Did not need intubation.  Today swelling started at 5:00. He reports has not gotten much wors, stayed about the same.  No longer on any ACE inhibitor. Patient's brother has the same.  Past Medical History:  Diagnosis Date  . Angioedema   . Hypertension     Patient Active Problem List   Diagnosis Date Noted  . Tobacco dependence 08/20/2016  . Hypertension 07/22/2016  . Angioedema 07/02/2016    Past Surgical History:  Procedure Laterality Date  . surgery on genitals    . tracheosotmy         Home Medications    Prior to Admission medications   Medication Sig Start Date End Date Taking? Authorizing Provider  amLODipine (NORVASC) 10 MG tablet Take 1 tablet (10 mg total) by mouth daily. 08/20/16  Yes Ladell Pier, MD  cloNIDine (CATAPRES) 0.1 MG tablet Take 1 tablet (0.1 mg total) by mouth 2 (two) times daily. 08/20/16   Ladell Pier, MD    Family History Family History  Problem Relation Age of Onset  . Urticaria Mother   . Angioedema Brother   . Asthma Neg Hx   . Allergic rhinitis Neg Hx   . Eczema Neg Hx   . Immunodeficiency Neg Hx     Social History Social History  Substance Use Topics  . Smoking status: Current Every Day Smoker    Types: Cigarettes  . Smokeless tobacco: Current User  . Alcohol use Yes     Comment: occ     Allergies   Lisinopril and Ace inhibitors   Review of Systems Review of Systems  Constitutional: Negative for  activity change.  HENT: Negative for drooling, trouble swallowing and voice change.   Respiratory: Negative for shortness of breath.   Cardiovascular: Negative for chest pain.  Gastrointestinal: Negative for abdominal pain.  All other systems reviewed and are negative.    Physical Exam Updated Vital Signs BP (!) 141/96 (BP Location: Right Arm)   Pulse 79   Temp 97.6 F (36.4 C) (Oral)   Resp 12   SpO2 97%   Physical Exam  Constitutional: He is oriented to person, place, and time. He appears well-nourished.  HENT:  Head: Normocephalic.  Left-sided tongue swelling. Mild submandibular swelling. Right-sided tongue remains normal in size.  Eyes: Conjunctivae and EOM are normal. Pupils are equal, round, and reactive to light. Right eye exhibits no discharge. Left eye exhibits no discharge.  Neck: No thyromegaly present.  Cardiovascular: Normal rate and regular rhythm.   Pulmonary/Chest: Effort normal and breath sounds normal. No stridor. No respiratory distress.  Abdominal: Soft. Bowel sounds are normal. He exhibits no distension. There is no tenderness.  Neurological: He is oriented to person, place, and time.  Skin: Skin is warm and dry. He is not diaphoretic.  Psychiatric: He has a normal mood and affect. His behavior is normal.     ED Treatments / Results  Labs (all labs ordered are listed, but only abnormal results are  displayed) Labs Reviewed  CBC WITH DIFFERENTIAL/PLATELET - Abnormal; Notable for the following:       Result Value   RBC 4.19 (*)    Hemoglobin 12.8 (*)    HCT 38.2 (*)    All other components within normal limits  COMPREHENSIVE METABOLIC PANEL  PROCALCITONIN    EKG  EKG Interpretation  Date/Time:  Tuesday Aug 24 2016 18:33:57 EDT Ventricular Rate:  91 PR Interval:    QRS Duration: 87 QT Interval:  343 QTC Calculation: 422 R Axis:   -15 Text Interpretation:  Age not entered, assumed to be  51 years old for purpose of ECG interpretation Sinus  rhythm Probable left atrial enlargement Borderline left axis deviation Abnormal R-wave progression, early transition Minimal ST elevation, anterior leads Normal sinus rhythm Confirmed by Thomasene Lot Ross [phone]) on 08/24/2016 7:43:35 PM       Radiology Dg Neck Soft Tissue  Result Date: 08/24/2016 CLINICAL DATA:  Swollen throat and tongue EXAM: NECK SOFT TISSUES - 1+ VIEW COMPARISON:  08/15/2016, 07/02/2016 FINDINGS: Reversal of cervical lordosis. Prevertebral soft tissue thickness appears normal. Enlargement of the submandibular soft tissues. Suboptimal visualization of subglottic trachea and epiglottis. IMPRESSION: Enlargement of the submandibular soft tissues. Prevertebral soft tissue thickness appears normal. Suboptimal visualization of the subglottic trachea Electronically Signed   By: Donavan Foil M.D.   On: 08/24/2016 20:37   Dg Chest Port 1 View  Result Date: 08/24/2016 CLINICAL DATA:  Swollen throat and tongue EXAM: PORTABLE CHEST 1 VIEW COMPARISON:  07/02/2016 FINDINGS: The heart size and mediastinal contours are within normal limits. Both lungs are clear. The visualized skeletal structures are unremarkable. IMPRESSION: No active disease. Electronically Signed   By: Franchot Gallo M.D.   On: 08/24/2016 20:43    Procedures Procedures (including critical care time)  Medications Ordered in ED Medications  0.9 %  sodium chloride infusion ( Intravenous New Bag/Given 08/24/16 1851)  0.9 %  sodium chloride infusion ( Intravenous New Bag/Given 08/24/16 1956)  methylPREDNISolone sodium succinate (SOLU-MEDROL) 40 mg/mL injection 40 mg (not administered)  diphenhydrAMINE (BENADRYL) injection 25 mg (not administered)  famotidine (PEPCID) IVPB 20 mg premix (not administered)  methylPREDNISolone sodium succinate (SOLU-MEDROL) 125 mg/2 mL injection 125 mg (125 mg Intravenous Given 08/24/16 1842)  diphenhydrAMINE (BENADRYL) injection 50 mg (50 mg Intravenous Given 08/24/16 1842)  famotidine  (PEPCID) IVPB 20 mg premix (0 mg Intravenous Stopped 08/24/16 1931)  C1 esterase inhibitor (Human) (BERINERT) injection 2,000 Units (2,000 Units Intravenous Given 08/24/16 1955)     Initial Impression / Assessment and Plan / ED Course  I have reviewed the triage vital signs and the nursing notes.  Pertinent labs & imaging results that were available during my care of the patient were reviewed by me and considered in my medical decision making (see chart for details).     Patient is a 51 year old male presenting with injury edema to left side of his tongue. Patient has multiple times the past. He required interventions in the past. We will immediately give steroids, Benadryl, Pepcid. Patient says it's not gotten much worse since starting. In the last notes Dr. Titus Mould recommends potential Berinertt for hereditary angioedema. I do not see any notes following this up. Patient did see allergist as an outpatient and was told just to discontinue lisinopril.  8:55 PM Initially ordered Berinert. However on reeval, patient feels much improved.  Will hold on that medication. But hope for reeval of this medication on discharge.    8:55 PM Discussed with care  and medication given. Patient still remains improved. Critical care will consult on patient and will admit to stepdown.  CRITICAL CARE Performed by: Gardiner Sleeper Total critical care time: 60 minutes Critical care time was exclusive of separately billable procedures and treating other patients. Critical care was necessary to treat or prevent imminent or life-threatening deterioration. Critical care was time spent personally by me on the following activities: development of treatment plan with patient and/or surrogate as well as nursing, discussions with consultants, evaluation of patient's response to treatment, examination of patient, obtaining history from patient or surrogate, ordering and performing treatments and interventions, ordering  and review of laboratory studies, ordering and review of radiographic studies, pulse oximetry and re-evaluation of patient's condition.   Final Clinical Impressions(s) / ED Diagnoses   Final diagnoses:  Angioedema    New Prescriptions New Prescriptions   No medications on file     Macarthur Critchley, MD 08/24/16 2055

## 2016-08-24 NOTE — ED Notes (Addendum)
Called to give report to ICU RN unavaliable

## 2016-08-24 NOTE — Care Management (Signed)
ED CM reviewed CM consult for medication needs. CM will follow for discharge needs, patient is being admitted. Venita Sheffield RN CCM

## 2016-08-24 NOTE — H&P (Signed)
History and Physical    Robert Reyes ION:[id number] DOB: Jun 10, 1965 DOA: 08/24/2016  Referring MD/NP/PA: Dr. Sander Nephew PCP: Ladell Pier, MD  Patient coming from: Work via ems  Chief Complaint: Left sided tongue swelling  HPI: Robert Reyes is a 51 y.o. male with medical history significant of angioedema and HTN; who presents with left-sided tongue swelling. Patient is recently admitted on 3/30-4/2 for angioedema after patient had just had been discharged from [facility] for the same. He reports taking only amlodipine, although primary care provider had recently started him on clonidine. He was seen by  the allergist Dr. Edgar Frisk in [facility], but reports that he was just told that the symptoms were secondary to the ACE inhibitor and no further testing was performed. Today at around 5 PM while going into work patient had acute onset of left sided tongue swelling. Denies any significant changes or new foods prior to going into work. Symptoms progressively worsened to the point which he had where he noted change in his voice. Denies any significant wheezing,  Itching, rash, or shortness of breath. He took nothing to try to treat symptoms as he previously had been given a epinephrine and and required intubation shortly thereafter using it during his last episode. He admits to a similar thing happening to his brother years ago after being placed on an ACE inhibitor. Review of records shows that during last hospitalization C1 inhibitor was elevated at 51.   ED Course: On admission into the emergency department patient was seen to be afebrile with respirations up to 24, but O2 saturations maintained on room air.  labs were relatively unremarkable.Patient was given a prednisone, Benadryl, Pepcid, and C1 esterase inhibitor while in the ED. Critical care was consulted and recommended admission to stepdown by the hospitalist.   Review of Systems: As per HPI otherwise 10 point  review of systems negative.   Past Medical History:  Diagnosis Date  . Angioedema   . Hypertension     Past Surgical History:  Procedure Laterality Date  . surgery on genitals    . tracheosotmy       reports that he has been smoking Cigarettes.  He uses smokeless tobacco. He reports that he drinks alcohol. He reports that he uses drugs, including Marijuana.  Allergies  Allergen Reactions  . Lisinopril Swelling    Angioedema with Lisinopril/ HCTZ  . Ace Inhibitors Swelling    Angioedema    Family History  Problem Relation Age of Onset  . Urticaria Mother   . Angioedema Brother   . Asthma Neg Hx   . Allergic rhinitis Neg Hx   . Eczema Neg Hx   . Immunodeficiency Neg Hx     Prior to Admission medications   Medication Sig Start Date End Date Taking? Authorizing Provider  amLODipine (NORVASC) 10 MG tablet Take 1 tablet (10 mg total) by mouth daily. 08/20/16  Yes Ladell Pier, MD  cloNIDine (CATAPRES) 0.1 MG tablet Take 1 tablet (0.1 mg total) by mouth 2 (two) times daily. 08/20/16   Ladell Pier, MD    Physical Exam:    Constitutional: NAD, calm, comfortable Vitals:   08/24/16 1829 08/24/16 1940 08/24/16 2014  BP: (!) 152/105 (!) 144/101 (!) 141/96  Pulse: 89 86 79  Resp: (!) 24 18 12   Temp: 97.6 F (36.4 C)    TempSrc: Oral    SpO2: 96% 98% 97%   Eyes: PERRL, lids and conjunctivae normal ENMT: Left-sided tongue swelling without significant  lesions appreciated Neck: normal, supple, no masses, no thyromegaly, no stridor. Healed tracheostomy scar.  Respiratory: clear to auscultation bilaterally, no wheezing, no crackles. Normal respiratory effort. No accessory muscle use.  Cardiovascular: Regular rate and rhythm, no murmurs / rubs / gallops. No extremity edema. 2+ pedal pulses. No carotid bruits.  Abdomen: no tenderness, no masses palpated. No hepatosplenomegaly. Bowel sounds positive.  Musculoskeletal: no clubbing / cyanosis. No joint deformity upper  and lower extremities. Good ROM, no contractures. Normal muscle tone.  Skin: no rashes, lesions, ulcers. No induration Neurologic: CN 2-12 grossly intact. Sensation intact, DTR normal. Strength 5/5 in all 4.  Psychiatric: Normal judgment and insight. Alert and oriented x 3. Normal mood.     Labs on Admission: I have personally reviewed following labs and imaging studies  CBC:  Recent Labs Lab 08/24/16 1855  WBC 6.1  NEUTROABS 3.7  HGB 12.8*  HCT 38.2*  MCV 91.2  PLT 921   Basic Metabolic Panel:  Recent Labs Lab 08/24/16 1855  NA 139  K 4.3  CL 110  CO2 23  GLUCOSE 91  BUN 18  CREATININE 1.06  CALCIUM 8.9   GFR: Estimated Creatinine Clearance: 92.2 mL/min (by C-G formula based on SCr of 1.06 mg/dL). Liver Function Tests:  Recent Labs Lab 08/24/16 1855  AST 19  ALT 27  ALKPHOS 60  BILITOT 0.8  PROT 7.5  ALBUMIN 4.1   No results for input(s): LIPASE, AMYLASE in the last 168 hours. No results for input(s): AMMONIA in the last 168 hours. Coagulation Profile: No results for input(s): INR, PROTIME in the last 168 hours. Cardiac Enzymes: No results for input(s): CKTOTAL, CKMB, CKMBINDEX, TROPONINI in the last 168 hours. BNP (last 3 results) No results for input(s): PROBNP in the last 8760 hours. HbA1C: No results for input(s): HGBA1C in the last 72 hours. CBG: No results for input(s): GLUCAP in the last 168 hours. Lipid Profile: No results for input(s): CHOL, HDL, LDLCALC, TRIG, CHOLHDL, LDLDIRECT in the last 72 hours. Thyroid Function Tests: No results for input(s): TSH, T4TOTAL, FREET4, T3FREE, THYROIDAB in the last 72 hours. Anemia Panel: No results for input(s): VITAMINB12, FOLATE, FERRITIN, TIBC, IRON, RETICCTPCT in the last 72 hours. Urine analysis:    Component Value Date/Time   COLORURINE YELLOW 08/15/2016 2228   APPEARANCEUR CLEAR 08/15/2016 2228   LABSPEC 1.026 08/15/2016 2228   PHURINE 5.0 08/15/2016 2228   GLUCOSEU NEGATIVE 08/15/2016 2228    HGBUR NEGATIVE 08/15/2016 2228   BILIRUBINUR NEGATIVE 08/15/2016 2228   KETONESUR NEGATIVE 08/15/2016 2228   PROTEINUR NEGATIVE 08/15/2016 2228   NITRITE NEGATIVE 08/15/2016 2228   LEUKOCYTESUR LARGE (A) 08/15/2016 2228   Sepsis Labs: No results found for this or any previous visit (from the past 240 hour(s)).   Radiological Exams on Admission: Dg Neck Soft Tissue  Result Date: 08/24/2016 CLINICAL DATA:  Swollen throat and tongue EXAM: NECK SOFT TISSUES - 1+ VIEW COMPARISON:  08/15/2016, 07/02/2016 FINDINGS: Reversal of cervical lordosis. Prevertebral soft tissue thickness appears normal. Enlargement of the submandibular soft tissues. Suboptimal visualization of subglottic trachea and epiglottis. IMPRESSION: Enlargement of the submandibular soft tissues. Prevertebral soft tissue thickness appears normal. Suboptimal visualization of the subglottic trachea Electronically Signed   By: Donavan Foil M.D.   On: 08/24/2016 20:37   Dg Chest Port 1 View  Result Date: 08/24/2016 CLINICAL DATA:  Swollen throat and tongue EXAM: PORTABLE CHEST 1 VIEW COMPARISON:  07/02/2016 FINDINGS: The heart size and mediastinal contours are within normal limits. Both  lungs are clear. The visualized skeletal structures are unremarkable. IMPRESSION: No active disease. Electronically Signed   By: Franchot Gallo M.D.   On: 08/24/2016 20:43    EKG: Independently reviewed. Sinus rhythm  Assessment/Plan Angioedema: Recurrent. Patient presents again with recurrent left-sided tongue swelling. Unknown precipitating cause at this time as patient denies any use of ace inhibitors. Patient's previously noted to be elevated levels of C1 inhibitor esterase 51, C3 215, total complement >60, C4 46 on 07/02/2016.  - Admit to  stepdown unit   - Continuous pulse oximetry - Continue prednisone, Benadryl, and Pepcid IV - Check UDS  - Appreciate PCCM consultative services, will follow-up for further recommendation - Will need to call  allergist Dr. Edgar Frisk  in a.m. for need of another appointment for further allergy testing has not was done during his last appointment and symptoms were attributed to ACE inhibitor use. - Question need of testing for possible antibody to C1 inhibitor as possible cause of increased production   Essential hypertension - Continue amlodipine  Tobacco abuse  - will need to counsel the the need of cessation of tobacco   DVT prophylaxis: lovenox Code Status: Full Family Communication: Discussed plan of care with the patient's wife Disposition Plan:Likely discharge home once medically stable  Consults called: PCCM Admission status: Observation  Norval Morton MD [facility] Pager [phone]  If 7PM-7AM, please contact night-coverage [email or web address] Password [facility]  08/24/2016, 10:01 PM

## 2016-08-24 NOTE — ED Triage Notes (Signed)
Per pt, states history of angioedema-has been hospitalized for same symptoms in April-states in past, symptoms due to BP med-tongue swollen--no s/s's of respiratory distress

## 2016-08-25 ENCOUNTER — Telehealth: Payer: Self-pay | Admitting: *Deleted

## 2016-08-25 DIAGNOSIS — I10 Essential (primary) hypertension: Secondary | ICD-10-CM

## 2016-08-25 DIAGNOSIS — R0902 Hypoxemia: Secondary | ICD-10-CM

## 2016-08-25 DIAGNOSIS — T783XXD Angioneurotic edema, subsequent encounter: Secondary | ICD-10-CM

## 2016-08-25 LAB — CBC
HCT: 38.6 % — ABNORMAL LOW (ref 39.0–52.0)
HEMOGLOBIN: 13 g/dL (ref 13.0–17.0)
MCH: 30.7 pg (ref 26.0–34.0)
MCHC: 33.7 g/dL (ref 30.0–36.0)
MCV: 91.3 fL (ref 78.0–100.0)
PLATELETS: 263 10*3/uL (ref 150–400)
RBC: 4.23 MIL/uL (ref 4.22–5.81)
RDW: 14.9 % (ref 11.5–15.5)
WBC: 9.5 10*3/uL (ref 4.0–10.5)

## 2016-08-25 LAB — BASIC METABOLIC PANEL
ANION GAP: 9 (ref 5–15)
BUN: 16 mg/dL (ref 6–20)
CHLORIDE: 109 mmol/L (ref 101–111)
CO2: 21 mmol/L — ABNORMAL LOW (ref 22–32)
Calcium: 9.4 mg/dL (ref 8.9–10.3)
Creatinine, Ser: 0.99 mg/dL (ref 0.61–1.24)
GFR calc Af Amer: >60 mL/min (ref >60)
GLUCOSE: 143 mg/dL — AB (ref 65–99)
POTASSIUM: 4.5 mmol/L (ref 3.5–5.1)
Sodium: 139 mmol/L (ref 135–145)

## 2016-08-25 LAB — RAPID URINE DRUG SCREEN, HOSP PERFORMED
Amphetamines: NOT DETECTED
BENZODIAZEPINES: NOT DETECTED
Barbiturates: NOT DETECTED
COCAINE: NOT DETECTED
Opiates: NOT DETECTED
Tetrahydrocannabinol: NOT DETECTED

## 2016-08-25 LAB — MRSA PCR SCREENING: MRSA BY PCR: NEGATIVE

## 2016-08-25 LAB — PROCALCITONIN: Procalcitonin: 0.11 ng/mL

## 2016-08-25 MED ORDER — METHYLPREDNISOLONE SODIUM SUCC 40 MG IJ SOLR
40.0000 mg | Freq: Three times a day (TID) | INTRAMUSCULAR | Status: DC
  Administered 2016-08-25 – 2016-08-26 (×3): 40 mg via INTRAVENOUS
  Filled 2016-08-25 (×3): qty 1

## 2016-08-25 NOTE — Progress Notes (Signed)
Name: Robert Reyes MRN: [id number] DOB: Jul 22, 1965    ADMISSION DATE:  08/24/2016 CONSULTATION DATE:  08/24/2016  REFERRING MD :  Dr. Thomasene Lot, EDP  CHIEF COMPLAINT:  Angioedema   HISTORY OF PRESENT ILLNESS:   51 year old male with PMH of HTN and recurrent angioedema. Has had reported 4-5 reported episodes over the last 4 years in which at times had required intubation/trachestomy. Recently Admitted 3/30-07/05/16 for angioedema. Did not require intubation. Followed up with allergy specialist on 4/19. Noted that during all episodes patient has been on ACE inhibitor. Lisinopril was discontinued on 3/30. Presented to PCP on 5/18 to readdress HTN medications. Ordered low-dose clonidine > however patient stated has not started this medication as of yet.   5/22 patient presented to ED with reported tongue swelling in which he noticed around 5 pm. States that the swelling has not gotten worse since. Upon arrival to ED noted to have left-sided tongue and mild submandibular swelling. Given Solu-medrol, Benadryl, and Pepcid. PCCM asked to consult.   SIGNIFICANT EVENTS  5/22 > Presents to ED with angioedema  STUDIES:  CXR 5/22 >> XR Soft Neck 5/22 >>  SUBJECTIVE:  Feels better but reports tongue remains swollen and voice is different  VITAL SIGNS: Temp:  [97.6 F (36.4 C)-97.7 F (36.5 C)] 97.7 F (36.5 C) (05/23 0800) Pulse Rate:  [66-89] 66 (05/23 0600) Resp:  [0-24] 15 (05/23 0600) BP: (108-155)/(72-105) 132/90 (05/23 0600) SpO2:  [96 %-99 %] 98 % (05/23 0600) Weight:  [89.6 kg (197 lb 8.5 oz)-90.7 kg (199 lb 15.3 oz)] 89.6 kg (197 lb 8.5 oz) (05/23 0400)  PHYSICAL EXAMINATION: General:  Adult male, no distress  Neuro:  Alert, oriented, slurred speech due to swollen tongue on the left HEENT:  Left sided tongue swelling but improving Cardiovascular:  RRR, no MRG, NI S1/S2 Lungs:  Clear breath sounds, non-labored  Abdomen:  Obese, active bowel sounds  Musculoskeletal:  -edema    Skin:  Warm, dry, intact    Recent Labs Lab 08/24/16 1855 08/25/16 0009  NA 139 139  K 4.3 4.5  CL 110 109  CO2 23 21*  BUN 18 16  CREATININE 1.06 0.99  GLUCOSE 91 143*    Recent Labs Lab 08/24/16 1855 08/25/16 0009  HGB 12.8* 13.0  HCT 38.2* 38.6*  WBC 6.1 9.5  PLT 258 263   Dg Neck Soft Tissue  Result Date: 08/24/2016 CLINICAL DATA:  Swollen throat and tongue EXAM: NECK SOFT TISSUES - 1+ VIEW COMPARISON:  08/15/2016, 07/02/2016 FINDINGS: Reversal of cervical lordosis. Prevertebral soft tissue thickness appears normal. Enlargement of the submandibular soft tissues. Suboptimal visualization of subglottic trachea and epiglottis. IMPRESSION: Enlargement of the submandibular soft tissues. Prevertebral soft tissue thickness appears normal. Suboptimal visualization of the subglottic trachea Electronically Signed   By: Donavan Foil M.D.   On: 08/24/2016 20:37   Dg Chest Port 1 View  Result Date: 08/24/2016 CLINICAL DATA:  Swollen throat and tongue EXAM: PORTABLE CHEST 1 VIEW COMPARISON:  07/02/2016 FINDINGS: The heart size and mediastinal contours are within normal limits. Both lungs are clear. The visualized skeletal structures are unremarkable. IMPRESSION: No active disease. Electronically Signed   By: Franchot Gallo M.D.   On: 08/24/2016 20:43   I reviewed CXR myself, no acute disease noted  ASSESSMENT / PLAN:  Angioedema with unclear etiology, hereditary vs medication effect - Only takes norvasc, would continue, suspect related to environmental at this point unless hereditary work up below shows any positive results -  F/U C1 51, C3  215, C4  46, CH50 > 60 - ANA negative, RA<10 and ESR is 42 Plan - Keep NPO while swollen - Monitor for airway protection in SDU for one more day - Continue steroids, H1 and H2 blockers for now - May restart norvasc - Keep in SDU - Will need to f/u with allergy  Discussed with PCCM-NP.  PCCM will sign off, please call back if  needed.  Rush Farmer, M.D. [facility] Pulmonary/Critical Care Medicine. Pager: [phone]. After hours pager: [phone].

## 2016-08-25 NOTE — Telephone Encounter (Signed)
Dr Thomasene Lot from ED called inquiring about when should patient return for visit advised as written per Dr Verlin Fester return advised note said return if symptoms get worse. Patient will call to schedule appt

## 2016-08-25 NOTE — Progress Notes (Signed)
Patient ID: Robert Reyes, male   DOB: 1966-02-09, 51 y.o.   MRN: [id number]  PROGRESS NOTE    Robert Reyes  QQP:[id number] DOB: 1965/07/19 DOA: 08/24/2016 PCP: Ladell Pier, MD   Brief Narrative: 51 y.o. male with medical history significant of angioedema and HTN; who presented with left-sided tongue swelling. Patient was recently admitted on 3/30-4/2 for angioedema after patient had just had been discharged from [facility] for the same.  He was seen by  the allergist Dr. Edgar Frisk in [facility], but reports that he was just told that the symptoms were secondary to the ACE inhibitor and no further testing was performed. Patient was admitted again with left-sided tongue swelling and angioedema. He feels slightly better.   Assessment & Plan:   Principal Problem:   Angioedema Active Problems:   Hypertension   Tobacco dependence  Angioedema: Recurrent.  Patient presents again with recurrent left-sided tongue swelling. Unknown precipitating cause at this time as patient denies any use of ace inhibitors. Patient's previously noted to be elevated levels of C1 inhibitor esterase 51, C3 215, total complement >60, C4 46 on 07/02/2016.  - The tongue swelling is improving. - PCCM consult and follow-up has been appreciated. Continue with Solu Medrol but decrease to 40 mg IV every 8 hours, Benadryl and Pepcid. - Continuous pulse oximetry. Spoke with staff at Dr. Eulas Post [name]'s office and they recommend outpatient follow-up.  Essential hypertension - Continue amlodipine  Tobacco abuse  - will need to counsel the the need of cessation of tobacco    DVT prophylaxis: Lovenox Code Status:  Full Family Communication: Discussed with wife present at bedside  Disposition Plan: Probable discharge home tomorrow if symptoms improve  Consultants: PCCM  Procedures: None  Antimicrobials: None   Subjective: Patient seen and examined at bedside. He denies any  overnight fever, nausea, vomiting or shortness of breath. His tongue swelling is improving  Objective: Vitals:   08/25/16 0600 08/25/16 0800 08/25/16 0900 08/25/16 1000  BP: 132/90 (!) 132/93  (!) 153/89  Pulse: 66 71 72 70  Resp: 15 18 17  (!) 21  Temp:  97.7 F (36.5 C)    TempSrc:  Oral    SpO2: 98% 96% 99% 99%  Weight:      Height:        Intake/Output Summary (Last 24 hours) at 08/25/16 1143 Last data filed at 08/25/16 0906  Gross per 24 hour  Intake           926.17 ml  Output              550 ml  Net           376.17 ml   Filed Weights   08/25/16 0013 08/25/16 0400  Weight: 90.7 kg (199 lb 15.3 oz) 89.6 kg (197 lb 8.5 oz)    Examination:  General exam: Appears calm and comfortable  Respiratory system: Bilateral decreased breath sound at bases Cardiovascular system: S1 & S2 heard, Rate controlled  Gastrointestinal system: Abdomen is nondistended, soft and nontender. Normal bowel sounds heard. Extremities: No cyanosis, clubbing, edema  ENT: Left-sided tongue swelling present   Data Reviewed: I have personally reviewed following labs and imaging studies  CBC:  Recent Labs Lab 08/24/16 1855 08/25/16 0009  WBC 6.1 9.5  NEUTROABS 3.7  --   HGB 12.8* 13.0  HCT 38.2* 38.6*  MCV 91.2 91.3  PLT 258 712   Basic Metabolic Panel:  Recent Labs Lab 08/24/16 1855 08/25/16 0009  NA 139 139  K 4.3 4.5  CL 110 109  CO2 23 21*  GLUCOSE 91 143*  BUN 18 16  CREATININE 1.06 0.99  CALCIUM 8.9 9.4   GFR: Estimated Creatinine Clearance: 97.2 mL/min (by C-G formula based on SCr of 0.99 mg/dL). Liver Function Tests:  Recent Labs Lab 08/24/16 1855  AST 19  ALT 27  ALKPHOS 60  BILITOT 0.8  PROT 7.5  ALBUMIN 4.1   No results for input(s): LIPASE, AMYLASE in the last 168 hours. No results for input(s): AMMONIA in the last 168 hours. Coagulation Profile: No results for input(s): INR, PROTIME in the last 168 hours. Cardiac Enzymes: No results for input(s):  CKTOTAL, CKMB, CKMBINDEX, TROPONINI in the last 168 hours. BNP (last 3 results) No results for input(s): PROBNP in the last 8760 hours. HbA1C: No results for input(s): HGBA1C in the last 72 hours. CBG: No results for input(s): GLUCAP in the last 168 hours. Lipid Profile: No results for input(s): CHOL, HDL, LDLCALC, TRIG, CHOLHDL, LDLDIRECT in the last 72 hours. Thyroid Function Tests: No results for input(s): TSH, T4TOTAL, FREET4, T3FREE, THYROIDAB in the last 72 hours. Anemia Panel: No results for input(s): VITAMINB12, FOLATE, FERRITIN, TIBC, IRON, RETICCTPCT in the last 72 hours. Sepsis Labs:  Recent Labs Lab 08/25/16 0009  PROCALCITON 0.11    Recent Results (from the past 240 hour(s))  MRSA PCR Screening     Status: None   Collection Time: 08/25/16 12:10 AM  Result Value Ref Range Status   MRSA by PCR NEGATIVE NEGATIVE Final    Comment:        The GeneXpert MRSA Assay (FDA approved for NASAL specimens only), is one component of a comprehensive MRSA colonization surveillance program. It is not intended to diagnose MRSA infection nor to guide or monitor treatment for MRSA infections.          Radiology Studies: Dg Neck Soft Tissue  Result Date: 08/24/2016 CLINICAL DATA:  Swollen throat and tongue EXAM: NECK SOFT TISSUES - 1+ VIEW COMPARISON:  08/15/2016, 07/02/2016 FINDINGS: Reversal of cervical lordosis. Prevertebral soft tissue thickness appears normal. Enlargement of the submandibular soft tissues. Suboptimal visualization of subglottic trachea and epiglottis. IMPRESSION: Enlargement of the submandibular soft tissues. Prevertebral soft tissue thickness appears normal. Suboptimal visualization of the subglottic trachea Electronically Signed   By: Donavan Foil M.D.   On: 08/24/2016 20:37   Dg Chest Port 1 View  Result Date: 08/24/2016 CLINICAL DATA:  Swollen throat and tongue EXAM: PORTABLE CHEST 1 VIEW COMPARISON:  07/02/2016 FINDINGS: The heart size and  mediastinal contours are within normal limits. Both lungs are clear. The visualized skeletal structures are unremarkable. IMPRESSION: No active disease. Electronically Signed   By: Franchot Gallo M.D.   On: 08/24/2016 20:43        Scheduled Meds: . amLODipine  10 mg Oral Daily  . diphenhydrAMINE  25 mg Intravenous Q6H  . enoxaparin (LOVENOX) injection  40 mg Subcutaneous QHS  . methylPREDNISolone (SOLU-MEDROL) injection  40 mg Intravenous Q8H   Continuous Infusions: . sodium chloride Stopped (08/25/16 0018)  . sodium chloride 100 mL/hr at 08/25/16 0800  . famotidine (PEPCID) IV Stopped (08/25/16 0906)     LOS: 0 days        Aline August, MD [facility] Pager [phone]  If 7PM-7AM, please contact night-coverage [email or web address] Password [facility] 08/25/2016, 11:43 AM

## 2016-08-26 LAB — PROCALCITONIN: Procalcitonin: <0.1 ng/mL

## 2016-08-26 MED ORDER — PREDNISONE 10 MG PO TABS: ORAL_TABLET | ORAL | 0 refills | Status: DC

## 2016-08-26 MED ORDER — CETIRIZINE HCL 10 MG PO TABS: 10.0000 mg | ORAL_TABLET | Freq: Two times a day (BID) | ORAL | 0 refills | Status: DC

## 2016-08-26 MED FILL — predniSONE 10 MG TABS: 10 | 8 days supply | Qty: 20 | Fill #0

## 2016-08-26 NOTE — Progress Notes (Signed)
Pt. Is requesting that I not give him his benadryl right now. States he is still pretty sleepy and would rather have it a little later.

## 2016-08-26 NOTE — Progress Notes (Signed)
Went over instructions with patient and his girlfriend.  Both verbalized instructions.  Left hospital via personal vehicle with AVS and note.

## 2016-08-26 NOTE — Discharge Summary (Signed)
Physician Discharge Summary  Robert Reyes ZOX:[id number] DOB: 1965-06-24 DOA: 08/24/2016  PCP: Ladell Pier, MD  Admit date: 08/24/2016 Discharge date: 08/26/2016  Admitted From: Home  Disposition:  Home  Recommendations for Outpatient Follow-up:  1. Follow up with PCP in 1-2 weeks Follow-up with Dr. Eulas Post [name]/allergy specialist in 1 week's time   Home Health: No Equipment/Devices: None  Discharge Condition: Stable CODE STATUS: Full  Diet recommendation: Heart Healthy   Brief/Interim Summary: 50 y.o.malewith medical history significant of angioedema and HTN; who presented with left-sided tongue swelling. Patient was recently admitted on 3/30-4/2 for angioedema after patient had just hadbeen discharged from [facility] for the same. He was seen by the allergist Dr. Teodora Medici [place],but reports that he was just told that the symptoms were secondary to the ACE inhibitor and no further testing was performed. Patient was admitted again with left-sided tongue swelling and angioedema. He was treated with intravenous Solu-Medrol, Benadryl and Pepcid. His left-sided TONGUE swelling has improved.  Discharge Diagnoses:  Principal Problem:   Angioedema Active Problems:   Hypertension   Tobacco dependence  Angioedema: Recurrent.  Patient presents again with recurrent left-sided tongue swelling. Unknown precipitating cause at this time as patient denies any use of ace inhibitors. Patient's previously noted to be elevated levels of C1 inhibitor esterase 51, C3 215, total complement >60, C4 46 on 07/02/2016.  - The tongue swelling is improved - PCCM consult and follow-up has been appreciated. Currently on Solu Medrol 40 mg IV every 8 hours, Benadryl and Pepcid. - Spoke with staff at Dr. Eulas Post [name]'s office yesterday and they recommend outpatient follow-up. - Discharge on tapering doses of prednisone and cetirizine 10 mg by mouth twice a day.  Follow-up with Dr. Eulas Post [name]'s office in a week's time  Essential hypertension - Continue amlodipine and clonidine  Tobacco abuse  - will need to counsel the the need of cessation of tobacco    Discharge Instructions  Discharge Instructions    Call MD for:  difficulty breathing, headache or visual disturbances    Complete by:  As directed    Call MD for:  hives    Complete by:  As directed    Call MD for:  persistant nausea and vomiting    Complete by:  As directed    Call MD for:  severe uncontrolled pain    Complete by:  As directed    Call MD for:  temperature >100.4    Complete by:  As directed    Diet - low sodium heart healthy    Complete by:  As directed    Increase activity slowly    Complete by:  As directed      Allergies as of 08/26/2016      Reactions   Lisinopril Swelling   Angioedema with Lisinopril/ HCTZ   Ace Inhibitors Swelling   Angioedema      Medication List    TAKE these medications   amLODipine 10 MG tablet Commonly known as:  NORVASC Take 1 tablet (10 mg total) by mouth daily.   cetirizine 10 MG tablet Commonly known as:  ZYRTEC Take 1 tablet (10 mg total) by mouth 2 (two) times daily.   cloNIDine 0.1 MG tablet Commonly known as:  CATAPRES Take 1 tablet (0.1 mg total) by mouth 2 (two) times daily.   predniSONE 10 MG tablet Commonly known as:  DELTASONE 40mg  daily for 2 days, 30mg  daily for 2 days, 20mg  daily for 2 days, 10mg  daily  for 2 days then stop      Follow-up Information    [name], Sedalia Muta, MD Follow up in 1 week(s).   Specialty:  Allergy and Immunology Contact information: [place] [address] [phone]        Ladell Pier, MD Follow up in 1 week(s).   Specialty:  Internal Medicine Contact information: [address] [place]  [address] [phone]          Allergies  Allergen Reactions  . Lisinopril Swelling    Angioedema with Lisinopril/ HCTZ  . Ace  Inhibitors Swelling    Angioedema    Consultations:  PCCM   Procedures/Studies: Dg Neck Soft Tissue  Result Date: 08/24/2016 CLINICAL DATA:  Swollen throat and tongue EXAM: NECK SOFT TISSUES - 1+ VIEW COMPARISON:  08/15/2016, 07/02/2016 FINDINGS: Reversal of cervical lordosis. Prevertebral soft tissue thickness appears normal. Enlargement of the submandibular soft tissues. Suboptimal visualization of subglottic trachea and epiglottis. IMPRESSION: Enlargement of the submandibular soft tissues. Prevertebral soft tissue thickness appears normal. Suboptimal visualization of the subglottic trachea Electronically Signed   By: Donavan Foil M.D.   On: 08/24/2016 20:37   Ct Head Wo Contrast  Result Date: 08/15/2016 CLINICAL DATA:  Hypertension.  Blurry vision. EXAM: CT HEAD WITHOUT CONTRAST TECHNIQUE: Contiguous axial images were obtained from the base of the skull through the vertex without intravenous contrast. COMPARISON:  None. FINDINGS: Brain: No evidence of acute infarction, hemorrhage, hydrocephalus, extra-axial collection or mass lesion/mass effect. Vascular: No hyperdense vessel or unexpected calcification. Skull: Normal. Negative for fracture or focal lesion. Sinuses/Orbits: Paranasal sinuses and mastoid air cells are clear. The visualized orbits are unremarkable. Other: None. IMPRESSION: No acute intracranial abnormality. Electronically Signed   By: Jeb Levering M.D.   On: 08/15/2016 22:45   Dg Chest Port 1 View  Result Date: 08/24/2016 CLINICAL DATA:  Swollen throat and tongue EXAM: PORTABLE CHEST 1 VIEW COMPARISON:  07/02/2016 FINDINGS: The heart size and mediastinal contours are within normal limits. Both lungs are clear. The visualized skeletal structures are unremarkable. IMPRESSION: No active disease. Electronically Signed   By: Franchot Gallo M.D.   On: 08/24/2016 20:43       Subjective: Patient seen and examined at bedside. He feels much better and wants to go home. No  overnight fever, nausea, vomiting  Discharge Exam: Vitals:   08/25/16 2101 08/26/16 0549  BP: (!) 145/84 140/89  Pulse: 85 74  Resp: 18 18  Temp: 98 F (36.7 C) 97.7 F (36.5 C)   Vitals:   08/25/16 1200 08/25/16 1418 08/25/16 2101 08/26/16 0549  BP: (!) 164/103 (!) 147/92 (!) 145/84 140/89  Pulse: 75 72 85 74  Resp: (!) 21 18 18 18   Temp: 97.5 F (36.4 C) 97.9 F (36.6 C) 98 F (36.7 C) 97.7 F (36.5 C)  TempSrc: Oral Oral Oral Oral  SpO2: 98% 100% 100% 100%  Weight:      Height:        General: Pt is alert, awake, not in acute distress Cardiovascular: RRR, S1/S2 + Respiratory: CTA bilaterally, no wheezing, no rhonchi Abdominal: Soft, NT, ND, bowel sounds + Extremities: no edema, no cyanosis Tongue: Left sided tongue swelling is resolved    The results of significant diagnostics from this hospitalization (including imaging, microbiology, ancillary and laboratory) are listed below for reference.     Microbiology: Recent Results (from the past 240 hour(s))  MRSA PCR Screening     Status: None   Collection Time:  08/25/16 12:10 AM  Result Value Ref Range Status   MRSA by PCR NEGATIVE NEGATIVE Final    Comment:        The GeneXpert MRSA Assay (FDA approved for NASAL specimens only), is one component of a comprehensive MRSA colonization surveillance program. It is not intended to diagnose MRSA infection nor to guide or monitor treatment for MRSA infections.      Labs: BNP (last 3 results) No results for input(s): BNP in the last 8760 hours. Basic Metabolic Panel:  Recent Labs Lab 08/24/16 1855 08/25/16 0009  NA 139 139  K 4.3 4.5  CL 110 109  CO2 23 21*  GLUCOSE 91 143*  BUN 18 16  CREATININE 1.06 0.99  CALCIUM 8.9 9.4   Liver Function Tests:  Recent Labs Lab 08/24/16 1855  AST 19  ALT 27  ALKPHOS 60  BILITOT 0.8  PROT 7.5  ALBUMIN 4.1   No results for input(s): LIPASE, AMYLASE in the last 168 hours. No results for input(s): AMMONIA  in the last 168 hours. CBC:  Recent Labs Lab 08/24/16 1855 08/25/16 0009  WBC 6.1 9.5  NEUTROABS 3.7  --   HGB 12.8* 13.0  HCT 38.2* 38.6*  MCV 91.2 91.3  PLT 258 263   Cardiac Enzymes: No results for input(s): CKTOTAL, CKMB, CKMBINDEX, TROPONINI in the last 168 hours. BNP: Invalid input(s): POCBNP CBG: No results for input(s): GLUCAP in the last 168 hours. D-Dimer No results for input(s): DDIMER in the last 72 hours. Hgb A1c No results for input(s): HGBA1C in the last 72 hours. Lipid Profile No results for input(s): CHOL, HDL, LDLCALC, TRIG, CHOLHDL, LDLDIRECT in the last 72 hours. Thyroid function studies No results for input(s): TSH, T4TOTAL, T3FREE, THYROIDAB in the last 72 hours.  Invalid input(s): FREET3 Anemia work up No results for input(s): VITAMINB12, FOLATE, FERRITIN, TIBC, IRON, RETICCTPCT in the last 72 hours. Urinalysis    Component Value Date/Time   COLORURINE YELLOW 08/15/2016 2228   APPEARANCEUR CLEAR 08/15/2016 2228   LABSPEC 1.026 08/15/2016 2228   PHURINE 5.0 08/15/2016 2228   GLUCOSEU NEGATIVE 08/15/2016 2228   HGBUR NEGATIVE 08/15/2016 2228   BILIRUBINUR NEGATIVE 08/15/2016 2228   KETONESUR NEGATIVE 08/15/2016 2228   PROTEINUR NEGATIVE 08/15/2016 2228   NITRITE NEGATIVE 08/15/2016 2228   LEUKOCYTESUR LARGE (A) 08/15/2016 2228   Sepsis Labs Invalid input(s): PROCALCITONIN,  WBC,  LACTICIDVEN Microbiology Recent Results (from the past 240 hour(s))  MRSA PCR Screening     Status: None   Collection Time: 08/25/16 12:10 AM  Result Value Ref Range Status   MRSA by PCR NEGATIVE NEGATIVE Final    Comment:        The GeneXpert MRSA Assay (FDA approved for NASAL specimens only), is one component of a comprehensive MRSA colonization surveillance program. It is not intended to diagnose MRSA infection nor to guide or monitor treatment for MRSA infections.      Time coordinating discharge: 35 minutes  SIGNED:   Aline August,  MD  [facility] 08/26/2016, 1:48 PM Pager: [phone]  If 7PM-7AM, please contact night-coverage [email or web address] Password [email or web address]

## 2016-08-26 NOTE — Discharge Instructions (Signed)
Angioedema  Angioedema is sudden swelling in the body. The swelling can happen in any part of the body. It often happens on the skin and causes itchy, bumpy patches (hives) to form.  This condition may:  · Happen only one time.  · Happen more than one time. It may come back at random times.  · Keep coming back for a number of years. Someday it may stop coming back.    Follow these instructions at home:  · Take over-the-counter and prescription medicines only as told by your doctor.  · If you were given medicines for emergency allergy treatment, always carry them with you.  · Wear a medical bracelet as told by your doctor.  · Avoid the things that cause your attacks (triggers).  · If this condition was passed to you from your parents and you want to have kids, talk to your doctor. Your kids may also have this condition.  Contact a doctor if:  · You have another attack.  · Your attacks happen more often, even after you take steps to prevent them.  · This condition was passed to you by your parents and you want to have kids.  Get help right away if:  · Your mouth, tongue, or lips get very swollen.  · You have trouble breathing.  · You have trouble swallowing.  · You pass out (faint).  This information is not intended to replace advice given to you by your health care provider. Make sure you discuss any questions you have with your health care provider.  Document Released: 03/10/2009 Document Revised: 10/22/2015 Document Reviewed: 09/30/2015  Elsevier Interactive Patient Education © 2017 [employer]vier Inc.

## 2016-09-06 ENCOUNTER — Ambulatory Visit: Payer: Self-pay | Admitting: Allergy and Immunology

## 2016-09-07 ENCOUNTER — Ambulatory Visit: Payer: Self-pay | Admitting: Allergy and Immunology

## 2016-09-07 DIAGNOSIS — J309 Allergic rhinitis, unspecified: Secondary | ICD-10-CM

## 2016-09-27 MED FILL — AMLODIPINE BESYLATE 10 MG T: 10 | 30 days supply | Qty: 30 | Fill #0

## 2016-09-27 MED FILL — ?CLONIDINE HCL 0.1 MG TABL: 0.1 | 30 days supply | Qty: 60 | Fill #0

## 2016-09-30 ENCOUNTER — Ambulatory Visit: Payer: Self-pay | Admitting: Internal Medicine

## 2016-10-01 ENCOUNTER — Encounter: Payer: Self-pay | Admitting: Internal Medicine

## 2016-10-01 ENCOUNTER — Ambulatory Visit: Payer: Self-pay | Attending: Internal Medicine | Admitting: Internal Medicine

## 2016-10-01 VITALS — BP 150/102 | HR 69 | Temp 99.0°F | Resp 16 | Wt 205.0 lb

## 2016-10-01 DIAGNOSIS — F17200 Nicotine dependence, unspecified, uncomplicated: Secondary | ICD-10-CM

## 2016-10-01 DIAGNOSIS — T783XXS Angioneurotic edema, sequela: Secondary | ICD-10-CM | POA: Insufficient documentation

## 2016-10-01 DIAGNOSIS — Z888 Allergy status to other drugs, medicaments and biological substances status: Secondary | ICD-10-CM | POA: Insufficient documentation

## 2016-10-01 DIAGNOSIS — Z79899 Other long term (current) drug therapy: Secondary | ICD-10-CM | POA: Insufficient documentation

## 2016-10-01 DIAGNOSIS — F17210 Nicotine dependence, cigarettes, uncomplicated: Secondary | ICD-10-CM | POA: Insufficient documentation

## 2016-10-01 DIAGNOSIS — I10 Essential (primary) hypertension: Secondary | ICD-10-CM | POA: Insufficient documentation

## 2016-10-01 MED ORDER — CLONIDINE HCL 0.2 MG PO TABS: 0.2000 mg | ORAL_TABLET | Freq: Two times a day (BID) | ORAL | 5 refills | Status: DC

## 2016-10-01 MED FILL — cloNIDine HCL 0.2 MG TABS: 0.2 | 30 days supply | Qty: 60 | Fill #0

## 2016-10-01 NOTE — Patient Instructions (Signed)
Give appointment with Stacy in 2 weeks for repeat blood pressure check.  Increase Clonidine to 0.2 mg twice a day. Continue to limit salt in foods.

## 2016-10-01 NOTE — Progress Notes (Signed)
Patient ID: Danner Paulding, male    DOB: [birth year]  MRN: [id number]  CC: Follow-up   Subjective: Robert Reyes is a 51 y.o. male who presents for follow-up on HTN. His concerns today include:  Last seen 08/20/2016 as noted patient for HTN and angioedema. Patient was started on clonidine 0.1 mg twice a day and was to continue Norvasc 10 mg daily. Since then he was hospitalized again with left-sided angioedema of the tongue. Patient reports he had not started the clonidine prior to that event. He was discharged with prednisone and was told to follow-up with his allergist. He did have an appointment with the allergist but had to cancel and it is rescheduled for a future date. He has not had any angioedema since that last hospitalization  1. HTN Just started Clonidine 4-5 days -no device at home to check blood pressure Trying to limit salt in the foods.  -no CP or SOB  2  tobacco dependence:  No cig in 6 days.  He is trying to quit   Patient Active Problem List   Diagnosis Date Noted  . Tobacco dependence 08/20/2016  . Hypertension 07/22/2016  . Angioedema 07/02/2016     Current Outpatient Prescriptions on File Prior to Visit  Medication Sig Dispense Refill  . amLODipine (NORVASC) 10 MG tablet Take 1 tablet (10 mg total) by mouth daily. 30 tablet 4  . cetirizine (ZYRTEC) 10 MG tablet Take 1 tablet (10 mg total) by mouth 2 (two) times daily. (Patient not taking: Reported on 10/01/2016) 30 tablet 0  . predniSONE (DELTASONE) 10 MG tablet 40mg  daily for 2 days, 30mg  daily for 2 days, 20mg  daily for 2 days, 10mg  daily for 2 days then stop (Patient not taking: Reported on 10/01/2016) 20 tablet 0   No current facility-administered medications on file prior to visit.     Allergies  Allergen Reactions  . Lisinopril Swelling    Angioedema with Lisinopril/ HCTZ  . Ace Inhibitors Swelling    Angioedema    Social History   Social History  . Marital status: Single   Spouse name: N/A  . Number of children: N/A  . Years of education: N/A   Occupational History  . Not on file.   Social History Main Topics  . Smoking status: Current Every Day Smoker    Types: Cigarettes  . Smokeless tobacco: Current User  . Alcohol use Yes     Comment: occ  . Drug use: Yes    Types: Marijuana     Comment: stopped 2-3 mths ago  05/2016  . Sexual activity: Yes    Partners: Female   Other Topics Concern  . Not on file   Social History Narrative  . No narrative on file    Family History  Problem Relation Age of Onset  . Urticaria Mother   . Angioedema Brother   . Asthma Neg Hx   . Allergic rhinitis Neg Hx   . Eczema Neg Hx   . Immunodeficiency Neg Hx     Past Surgical History:  Procedure Laterality Date  . surgery on genitals    . tracheosotmy      ROS: Review of Systems as stated above PHYSICAL EXAM: BP (!) 150/102   Pulse 69   Temp 99 F (37.2 C) (Oral)   Resp 16   Wt 205 lb (93 kg)   SpO2 100%   BMI 28.59 kg/m   Recheck 140/106 Physical Exam  General appearance - alert, well appearing,  and in no distress Mental status - alert, oriented to person, place, and time, normal mood, behavior, speech, dress, motor activity, and thought processes Neck - supple, no significant adenopathy Chest - clear to auscultation, no wheezes, rales or rhonchi, symmetric air entry Heart - normal rate, regular rhythm, normal S1, S2, no murmurs, rubs, clicks or gallops Extremities - peripheral pulses normal, no pedal edema, no clubbing or cyanosis   ASSESSMENT AND PLAN: 1. Essential hypertension Not at goal. Increase clonidine to 0.2 twice a day. Patient to see clinical pharmacists in 2 weeks. If blood pressure still not at goal would consider adding low-dose Coreg.  Would not use Ace inhibitors until cause is found for his recurrent angioedema. We'll also hold off on HCTZ for now - cloNIDine (CATAPRES) 0.2 MG tablet; Take 1 tablet (0.2 mg total) by mouth 2  (two) times daily.  Dispense: 60 tablet; Refill: 5  2. Tobacco dependence Commended him on attempts to quit. Encouraged him to try to remain tobacco free.  3. Angioedema, sequela -Advised follow-up with his allergist as soon as possible for further evaluation of recurrent angioedema.  Patient was given the opportunity to ask questions.  Patient verbalized understanding of the plan and was able to repeat key elements of the plan.   No orders of the defined types were placed in this encounter.    Requested Prescriptions   Signed Prescriptions Disp Refills  . cloNIDine (CATAPRES) 0.2 MG tablet 60 tablet 5    Sig: Take 1 tablet (0.2 mg total) by mouth 2 (two) times daily.    Return in about 1 month (around 10/31/2016).  Karle Plumber, MD, FACP

## 2016-10-11 ENCOUNTER — Ambulatory Visit: Payer: Self-pay | Attending: Internal Medicine

## 2016-10-12 ENCOUNTER — Ambulatory Visit (INDEPENDENT_AMBULATORY_CARE_PROVIDER_SITE_OTHER): Payer: Self-pay | Admitting: Allergy and Immunology

## 2016-10-12 ENCOUNTER — Ambulatory Visit: Payer: Self-pay | Attending: Internal Medicine

## 2016-10-12 ENCOUNTER — Encounter: Payer: Self-pay | Admitting: Allergy and Immunology

## 2016-10-12 VITALS — BP 120/70 | HR 77 | Temp 98.0°F | Resp 18 | Wt 204.6 lb

## 2016-10-12 DIAGNOSIS — T783XXA Angioneurotic edema, initial encounter: Secondary | ICD-10-CM | POA: Insufficient documentation

## 2016-10-12 DIAGNOSIS — X58XXXA Exposure to other specified factors, initial encounter: Secondary | ICD-10-CM | POA: Insufficient documentation

## 2016-10-12 DIAGNOSIS — Z79899 Other long term (current) drug therapy: Secondary | ICD-10-CM | POA: Insufficient documentation

## 2016-10-12 DIAGNOSIS — T783XXD Angioneurotic edema, subsequent encounter: Secondary | ICD-10-CM

## 2016-10-12 DIAGNOSIS — I10 Essential (primary) hypertension: Secondary | ICD-10-CM | POA: Insufficient documentation

## 2016-10-12 DIAGNOSIS — Z888 Allergy status to other drugs, medicaments and biological substances status: Secondary | ICD-10-CM | POA: Insufficient documentation

## 2016-10-12 DIAGNOSIS — T7840XD Allergy, unspecified, subsequent encounter: Secondary | ICD-10-CM

## 2016-10-12 DIAGNOSIS — T7840XA Allergy, unspecified, initial encounter: Secondary | ICD-10-CM | POA: Insufficient documentation

## 2016-10-12 DIAGNOSIS — F17210 Nicotine dependence, cigarettes, uncomplicated: Secondary | ICD-10-CM | POA: Insufficient documentation

## 2016-10-12 MED ORDER — EPINEPHRINE 0.3 MG/0.3ML IJ SOAJ: 0.3000 mg | Freq: Once | INTRAMUSCULAR | 2 refills | Status: AC

## 2016-10-12 NOTE — Assessment & Plan Note (Addendum)
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  Food allergen skin testing was negative today despite a positive histamine control.  The patient has not taken an ACE inhibitor since early April.   The following labs have been ordered: TSH, antithyroglobulin antibody, thyroid peroxidase antibody, FCeRI antibody, tryptase, C4, C1 esterase inhibitor (quantitative and functional), C1q CBC, CMP, and galactose-alpha-1,3-galactose IgE level.  The patient will be notified with further recommendations after lab results have returned.  Should symptoms recur, a  journal is to be kept recording any foods eaten, beverages consumed, medications taken, activities performed, and environmental conditions within a 6 hour period prior to the onset of symptoms. For any symptoms concerning for anaphylaxis, epinephrine is to be administered and 911 is to be called immediately.  A prescription has been provided for epinephrine 0.3 mg autoinjector 2 pack along with instructions for its proper administration.

## 2016-10-12 NOTE — Patient Instructions (Addendum)
Angioedema Angioedema versus anaphylaxis with unclear trigger.  Food allergen skin testing was negative today despite a positive histamine control.  The patient has not taken an ACE inhibitor since early April.   The following labs have been ordered: TSH, antithyroglobulin antibody, thyroid peroxidase antibody, FCeRI antibody, tryptase, C4, C1 esterase inhibitor (quantitative and functional), C1q CBC, CMP, and galactose-alpha-1,3-galactose IgE level.  The patient will be notified with further recommendations after lab results have returned.  Should symptoms recur, a  journal is to be kept recording any foods eaten, beverages consumed, medications taken, activities performed, and environmental conditions within a 6 hour period prior to the onset of symptoms. For any symptoms concerning for anaphylaxis, epinephrine is to be administered and 911 is to be called immediately.  A prescription has been provided for epinephrine 0.3 mg autoinjector 2 pack along with instructions for its proper administration.   When lab results have returned the patient will be called with further recommendations and follow up instructions.

## 2016-10-12 NOTE — Progress Notes (Signed)
Patient here for lab visit only 

## 2016-10-12 NOTE — Progress Notes (Signed)
Follow-up Note  RE: Robert Reyes MRN: [id number] DOB: Oct 05, 1965 Date of Office Visit: 10/12/2016  Primary care provider: Ladell Pier, MD Referring provider: Ladell Pier, MD  History of present illness: Robert Reyes is a 51 y.o. male with angioedema and hypertension presenting today for follow up.  He is previously seen in this office for his initial evaluation on 07/22/2016.  In May he was hospitalized for tongue angioedema.  He states that he was hospitalized in [facility] for 3 days and spent 1 day in the intensive care unit but did not require intubation.  He reports that he had consumed warm tea and almost immediately "everything started shutting down."  He reports that he experienced left-sided tongue swelling as well as the sensation of throat tightening Sofia took diphenhydramine and proceeded to the emergency department.  Approximately 2 weeks after discharge from the hospital he experienced another episode of tongue swelling.  He discontinued lisinopril in early April and has not taken an ACE inhibitor since that time.  He denies concomitant urticaria, cardiopulmonary symptoms, or other GI symptoms.    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  Food allergen skin testing was negative today despite a positive histamine control.  The patient has not taken an ACE inhibitor since early April.   The following labs have been ordered: TSH, antithyroglobulin antibody, thyroid peroxidase antibody, FCeRI antibody, tryptase, C4, C1 esterase inhibitor (quantitative and functional), C1q CBC, CMP, and galactose-alpha-1,3-galactose IgE level.  The patient will be notified with further recommendations after lab results have returned.  Should symptoms recur, a  journal is to be kept recording any foods eaten, beverages consumed, medications taken, activities performed, and environmental conditions within a 6 hour period  prior to the onset of symptoms. For any symptoms concerning for anaphylaxis, epinephrine is to be administered and 911 is to be called immediately.  A prescription has been provided for epinephrine 0.3 mg autoinjector 2 pack along with instructions for its proper administration.   Meds ordered this encounter  Medications  . EPINEPHrine (EPIPEN 2-PAK) 0.3 mg/0.3 mL IJ SOAJ injection    Sig: Inject 0.3 mLs (0.3 mg total) into the muscle once.    Dispense:  2 Device    Refill:  2    Diagnostics: Aeroallergen skin testing:  Negative despite a positive histamine control. Food allergen skin testing: Negative despite a positive histamine control.    Physical examination: Blood pressure 120/70, pulse 77, temperature 98 F (36.7 C), resp. rate 18, weight 204 lb 9.6 oz (92.8 kg), SpO2 98 %.  General: Alert, interactive, in no acute distress. HEENT: TMs pearly gray, turbinates mildly edematous without discharge, post-pharynx unremarkable. Neck: Supple without lymphadenopathy. Lungs: Clear to auscultation without wheezing, rhonchi or rales. CV: Normal S1, S2 without murmurs. Skin: Warm and dry, without lesions or rashes.  The following portions of the patient's history were reviewed and updated as appropriate: allergies, current medications, past family history, past medical history, past social history, past surgical history and problem list.  Allergies as of 10/12/2016      Reactions   Lisinopril Swelling   Angioedema with Lisinopril/ HCTZ   Ace Inhibitors Swelling   Angioedema      Medication List       Accurate as of 10/12/16  5:27 PM. Always use your most recent med list.          amLODipine 10 MG tablet Commonly known as:  NORVASC Take 1 tablet (10  mg total) by mouth daily.   cetirizine 10 MG tablet Commonly known as:  ZYRTEC Take 1 tablet (10 mg total) by mouth 2 (two) times daily.   cloNIDine 0.1 MG tablet Commonly known as:  CATAPRES Take 0.1 mg by mouth 2 (two)  times daily.   cloNIDine 0.2 MG tablet Commonly known as:  CATAPRES Take 0.2 mg by mouth 2 (two) times daily.   EPINEPHrine 0.3 mg/0.3 mL Soaj injection Commonly known as:  EPIPEN 2-PAK Inject 0.3 mLs (0.3 mg total) into the muscle once.       Allergies  Allergen Reactions  . Lisinopril Swelling    Angioedema with Lisinopril/ HCTZ  . Ace Inhibitors Swelling    Angioedema   Review of systems: Review of systems negative except as noted in HPI / PMHx or noted below: Constitutional: Negative.  HENT: Negative.   Eyes: Negative.  Respiratory: Negative.   Cardiovascular: Negative.  Gastrointestinal: Negative.  Genitourinary: Negative.  Musculoskeletal: Negative.  Neurological: Negative.  Endo/Heme/Allergies: Negative.  Cutaneous: Negative.  Past Medical History:  Diagnosis Date  . Angioedema   . Hypertension     Family History  Problem Relation Age of Onset  . Urticaria Mother   . Angioedema Brother   . Asthma Neg Hx   . Allergic rhinitis Neg Hx   . Eczema Neg Hx   . Immunodeficiency Neg Hx     Social History   Social History  . Marital status: Single    Spouse name: N/A  . Number of children: N/A  . Years of education: N/A   Occupational History  . Not on file.   Social History Main Topics  . Smoking status: Current Every Day Smoker    Types: Cigarettes  . Smokeless tobacco: Current User  . Alcohol use Yes     Comment: occ  . Drug use: Yes    Types: Marijuana     Comment: stopped 2-3 mths ago  05/2016  . Sexual activity: Yes    Partners: Female   Other Topics Concern  . Not on file   Social History Narrative  . No narrative on file    I appreciate the opportunity to take part in [name]'s care. Please do not hesitate to contact me with questions.  Sincerely,   R. Edgar Frisk, MD

## 2016-10-19 LAB — ALPHA-GAL PANEL
Alpha Gal IgE*: <0.1 kU/L (ref <0.35)
Beef (Bos spp) IgE: <0.1 kU/L (ref <0.35)
Class Interpretation: 0
Class Interpretation: 0
Class Interpretation: 0
Lamb/Mutton (Ovis spp) IgE: <0.1 kU/L (ref <0.35)
Pork (Sus spp) IgE: <0.1 kU/L (ref <0.35)

## 2016-10-19 LAB — CHRONIC URTICARIA: cu index: <1 (ref <10)

## 2016-10-22 LAB — TRYPTASE: Tryptase: 3.6 ug/L (ref 2.2–13.2)

## 2016-10-22 LAB — COMPLEMENT COMPONENT C1Q: Complement C1Q: 16 mg/dL (ref 11.8–23.8)

## 2016-10-22 LAB — C1 ESTERASE INHIBITOR, FUNCTIONAL: C1INH Functional/C1INH Total MFr SerPl: >109 %mean normal

## 2016-10-22 LAB — C4 COMPLEMENT: Complement C4, Serum: 38 mg/dL (ref 14–44)

## 2016-11-01 MED FILL — AMLODIPINE BESYLATE 10 MG T: 10 | 30 days supply | Qty: 30 | Fill #1

## 2016-11-04 ENCOUNTER — Telehealth: Payer: Self-pay | Admitting: Allergy and Immunology

## 2016-11-04 ENCOUNTER — Telehealth: Payer: Self-pay | Admitting: Internal Medicine

## 2016-11-04 DIAGNOSIS — K029 Dental caries, unspecified: Secondary | ICD-10-CM

## 2016-11-04 NOTE — Telephone Encounter (Signed)
Patient advised by H. Clark to contact office if symptoms return. I will call him and discuss this information again.

## 2016-11-04 NOTE — Telephone Encounter (Signed)
Patient received a bill for his visit with Dr. Verlin Fester on 7-10. Said he didn't have insurance at the time, but had applied for the [employer] and is now approved. Wants to know if they have paid anything on this.

## 2016-11-04 NOTE — Telephone Encounter (Signed)
Pt wife called back to speak to clinical staff that  Called about results from blood work and is asking to please return call to them again. They are frustrated with the multiple calls and confused as to what happens now with results.

## 2016-11-04 NOTE — Telephone Encounter (Signed)
Patient was sent to [facility] for blood work and since he didn't have insurance, he was sent to [facility] to have the testing done. Someone called him with results and told him he had hives but did not tell him if he needed to follow up with an appointment or what he was suppose to do about the hives.

## 2016-11-04 NOTE — Telephone Encounter (Signed)
When I called the patient he told me that he was wanting a referral to a dentist.  I told him that we don't usually give referrals to dentist. He said nothing about about his bill or charges.

## 2016-11-04 NOTE — Telephone Encounter (Signed)
I have advised patient again of results and instructions, per Dr. Verlin Fester. He acknowledged understanding of this. Will call back if needed.

## 2016-11-04 NOTE — Telephone Encounter (Signed)
Pt requesting urgent referral to dental due to a hole in tooth and smell coming from it. Please f/u. Thank you.

## 2016-11-16 NOTE — Telephone Encounter (Signed)
Patient's wife called the office asking to speak with referral coordinator regarding husband's dental referral. Olin Hauser mentioned that they had called in the beginning of August and no one has called them regarding an appt.   I spoke with Alinda Sierras, Referral Coordinator, and she stated that Chanel called her yesterday regarding this patient and informing her that PCP had placed urgent referral for him. Pt is in pain. Alinda Sierras explained that she hasn't gotten to it and that she can only send 10 dental referrals a month.   Informed patient of what referral coordinator informed me and he stated that once an opening becomes available to call him.

## 2016-11-19 ENCOUNTER — Telehealth: Payer: Self-pay

## 2016-11-19 MED FILL — cloNIDine HCL 0.2 MG TABS: 0.2 | 30 days supply | Qty: 60 | Fill #1

## 2016-11-19 NOTE — Telephone Encounter (Signed)
Error

## 2016-11-19 NOTE — Telephone Encounter (Signed)
Pt wife contacted the office and stated that she was on hold with another front office person and they got disconnected. Pt wife stated that she has not heard anything regarding referral for dentist. Pt wife states she called and asked why she hasn't heard anything. I informed pt wife that referral was placed on 11-04-16 and it does take 1-2 weeks for the referral. Pt wife then states the referral coordinator stated that she didn't receive referral I informed pt wife that the referral was placed. I look at last phone note and made pt wife aware that she can only send 10 urgent referral a month. Pt wife starting raising her voice stating she doesn't want to talk to me that I am rude. I informed pt wife that I am not being rude I am just trying to help. Pt wife states she doesn't want my help that she wants to speak to someone else. Went to [facility] made aware of the situation and transferred call to [facility]

## 2016-11-19 NOTE — Telephone Encounter (Signed)
Patient has an appointment with Dr Wynetta Emery on 11-25-16 @ 10am  For a Dental Referral  I will sent his referral as Urgent

## 2016-11-22 ENCOUNTER — Encounter: Payer: Self-pay | Admitting: Internal Medicine

## 2016-11-22 ENCOUNTER — Ambulatory Visit: Payer: Self-pay | Attending: Internal Medicine | Admitting: Internal Medicine

## 2016-11-22 VITALS — BP 138/95 | HR 78 | Temp 98.3°F | Resp 16 | Wt 205.4 lb

## 2016-11-22 DIAGNOSIS — I10 Essential (primary) hypertension: Secondary | ICD-10-CM

## 2016-11-22 DIAGNOSIS — F17200 Nicotine dependence, unspecified, uncomplicated: Secondary | ICD-10-CM | POA: Insufficient documentation

## 2016-11-22 DIAGNOSIS — K029 Dental caries, unspecified: Secondary | ICD-10-CM

## 2016-11-22 MED ORDER — PENICILLIN V POTASSIUM 250 MG PO TABS: 250.0000 mg | ORAL_TABLET | Freq: Four times a day (QID) | ORAL | 0 refills | Status: DC

## 2016-11-22 MED FILL — PENICILLIN VK 250 MG TABLET: 250 | 7 days supply | Qty: 28 | Fill #0

## 2016-11-22 NOTE — Patient Instructions (Signed)
Take the Penicillin antibiotics as prescribed.

## 2016-11-22 NOTE — Progress Notes (Signed)
   Patient ID: Robert Reyes, male    DOB: 1965/09/18  MRN: [id number]  CC:  Tooth pain  Subjective:  Daril Warga is a 51 y.o. male who presents for UC visit.  Girl-friend Olin Hauser is with him. His concerns today include:  Tooth ache in LT lower jaw for a while.   Has decayed molar. No jaw swelling Referral placed for dentist 11/04/2016 but no appt as yet.  Has OC. Referral specialist plans t submit as urgent  2.  HTN: out of Clonidine x 1  Day. Plans to fill today.  Took Norvasc already this a.m. No device to check BP   Patient Active Problem List   Diagnosis Date Noted  . Allergic reaction 10/12/2016  . Tobacco dependence 08/20/2016  . Hypertension 07/22/2016  . Angioedema 07/02/2016     Current Outpatient Prescriptions on File Prior to Visit  Medication Sig Dispense Refill  . amLODipine (NORVASC) 10 MG tablet Take 1 tablet (10 mg total) by mouth daily. 30 tablet 4  . cetirizine (ZYRTEC) 10 MG tablet Take 1 tablet (10 mg total) by mouth 2 (two) times daily. (Patient not taking: Reported on 10/01/2016) 30 tablet 0  . cloNIDine (CATAPRES) 0.1 MG tablet Take 0.1 mg by mouth 2 (two) times daily.  4  . cloNIDine (CATAPRES) 0.2 MG tablet Take 0.2 mg by mouth 2 (two) times daily.  5   No current facility-administered medications on file prior to visit.     Allergies  Allergen Reactions  . Lisinopril Swelling    Angioedema with Lisinopril/ HCTZ  . Ace Inhibitors Swelling    Angioedema     ROS: Review of Systems As stated above  PHYSICAL EXAM: BP (!) 138/95   Pulse 78   Temp 98.3 F (36.8 C) (Oral)   Resp 16   Wt 205 lb 6.4 oz (93.2 kg)   SpO2 98%   BMI 28.65 kg/m   Physical Exam General appearance - alert, well appearing, and in no distress Mental status - alert, oriented to person, place, and time, normal mood, behavior, speech, dress, motor activity, and thought processes Mouth -large cavity in last LT lower molar.  Surrounding tissue inflamed with  slight fluctuance. No puss expressed.  Jaw is not swollen   ASSESSMENT AND PLAN: 1. Dental cavity -given 1 wk for PCN V abx. -message sent to referral person to schedule dental appt  2. Essential hypertension -not at goal but better. Pt to refill Clonidine today and continue taking   Patient was given the opportunity to ask questions.  Patient verbalized understanding of the plan and was able to repeat key elements of the plan.   Orders Placed This Encounter  Procedures  . Ambulatory referral to Dentistry     Requested Prescriptions   Signed Prescriptions Disp Refills  . penicillin v potassium (VEETID) 250 MG tablet 28 tablet 0    Sig: Take 1 tablet (250 mg total) by mouth 4 (four) times daily.    No future appointments.  Karle Plumber, MD, FACP

## 2016-11-25 ENCOUNTER — Ambulatory Visit: Payer: Self-pay | Admitting: Internal Medicine

## 2016-11-30 MED FILL — AMLODIPINE BESYLATE 10 MG T: 10 | 30 days supply | Qty: 30 | Fill #2

## 2017-01-07 MED FILL — AMLODIPINE BESYLATE 10 MG T: 10 | 30 days supply | Qty: 30 | Fill #3

## 2017-01-07 MED FILL — ?CLONIDINE HCL 0.2 MG TABLE: 0.2 | 30 days supply | Qty: 60 | Fill #2

## 2017-02-15 MED FILL — AMLODIPINE BESYLATE 10 MG T: 10 | 30 days supply | Qty: 30 | Fill #4

## 2017-02-15 MED FILL — ?CLONIDINE HCL 0.2 MG TABLE: 0.2 | 30 days supply | Qty: 60 | Fill #3

## 2017-03-23 ENCOUNTER — Other Ambulatory Visit: Payer: Self-pay | Admitting: Internal Medicine

## 2017-03-23 DIAGNOSIS — I10 Essential (primary) hypertension: Secondary | ICD-10-CM

## 2017-04-06 MED FILL — AMLODIPINE BESYLATE 10 MG T: 10 | 30 days supply | Qty: 30 | Fill #0

## 2017-05-12 ENCOUNTER — Other Ambulatory Visit: Payer: Self-pay | Admitting: Internal Medicine

## 2017-05-12 DIAGNOSIS — I10 Essential (primary) hypertension: Secondary | ICD-10-CM

## 2017-05-16 ENCOUNTER — Ambulatory Visit: Payer: Commercial Managed Care - PPO | Attending: Internal Medicine | Admitting: Internal Medicine

## 2017-05-16 ENCOUNTER — Encounter: Payer: Self-pay | Admitting: Internal Medicine

## 2017-05-16 VITALS — BP 116/79 | HR 63 | Temp 97.6°F | Ht 72.0 in | Wt 213.4 lb

## 2017-05-16 DIAGNOSIS — Z23 Encounter for immunization: Secondary | ICD-10-CM | POA: Diagnosis not present

## 2017-05-16 DIAGNOSIS — Z760 Encounter for issue of repeat prescription: Secondary | ICD-10-CM | POA: Diagnosis present

## 2017-05-16 DIAGNOSIS — Z888 Allergy status to other drugs, medicaments and biological substances status: Secondary | ICD-10-CM | POA: Diagnosis not present

## 2017-05-16 DIAGNOSIS — Z79899 Other long term (current) drug therapy: Secondary | ICD-10-CM | POA: Diagnosis not present

## 2017-05-16 DIAGNOSIS — I10 Essential (primary) hypertension: Secondary | ICD-10-CM | POA: Diagnosis not present

## 2017-05-16 DIAGNOSIS — F17200 Nicotine dependence, unspecified, uncomplicated: Secondary | ICD-10-CM | POA: Diagnosis not present

## 2017-05-16 DIAGNOSIS — F17210 Nicotine dependence, cigarettes, uncomplicated: Secondary | ICD-10-CM | POA: Diagnosis not present

## 2017-05-16 MED ORDER — TETANUS-DIPHTH-ACELL PERTUSSIS 5-2.5-18.5 LF-MCG/0.5 IM SUSP: 0.5000 mL | Freq: Once | INTRAMUSCULAR | 0 refills | Status: AC

## 2017-05-16 MED ORDER — AMLODIPINE BESYLATE 10 MG PO TABS: 10.0000 mg | ORAL_TABLET | Freq: Every day | ORAL | 6 refills | Status: DC

## 2017-05-16 MED ORDER — NICOTINE POLACRILEX 2 MG MT GUM: 2.0000 mg | CHEWING_GUM | OROMUCOSAL | 0 refills | Status: DC | PRN

## 2017-05-16 MED ORDER — CLONIDINE HCL 0.2 MG PO TABS: 0.2000 mg | ORAL_TABLET | Freq: Two times a day (BID) | ORAL | 5 refills | Status: DC

## 2017-05-16 MED FILL — BOOSTRIX VACCINE SYRINGE: 5-2.5-18.5 | 1 days supply | Qty: 1 | Fill #0

## 2017-05-16 NOTE — Progress Notes (Signed)
Patient ID: Robert Reyes, male    DOB: 05-24-1965  MRN: [id number]  CC: Hypertension and Medication Refill   Subjective: Robert Reyes is a 52 y.o. male who presents for chronic ds management.  Last seen 11/2016 His concerns today include:  HTN, tob dep, recurrent angioedema  1.  HTN: needing RF on Norvasc.  Out x 2 days.  Took Clonidine already for today.  No device to check BP -tries to limi salt No CP/SOB/LE edema  2.  Tob dep:  Still smoking 1 every 2-3 days. Interested in trying nicotine gum  HM: due for flu shot and tdap.  Declines the former  Patient Active Problem List   Diagnosis Date Noted  . Allergic reaction 10/12/2016  . Tobacco dependence 08/20/2016  . Hypertension 07/22/2016  . Angioedema 07/02/2016     Current Outpatient Medications on File Prior to Visit  Medication Sig Dispense Refill  . cloNIDine (CATAPRES) 0.2 MG tablet Take 0.2 mg by mouth 2 (two) times daily.  5  . amLODipine (NORVASC) 10 MG tablet TAKE 1 TABLET BY MOUTH ONCE DAILY (Patient not taking: Reported on 05/16/2017) 30 tablet 0  . cetirizine (ZYRTEC) 10 MG tablet Take 1 tablet (10 mg total) by mouth 2 (two) times daily. (Patient not taking: Reported on 10/01/2016) 30 tablet 0  . cloNIDine (CATAPRES) 0.1 MG tablet Take 0.1 mg by mouth 2 (two) times daily.  4   No current facility-administered medications on file prior to visit.     Allergies  Allergen Reactions  . Lisinopril Swelling    Angioedema with Lisinopril/ HCTZ  . Ace Inhibitors Swelling    Angioedema    Social History   Socioeconomic History  . Marital status: Single    Spouse name: Not on file  . Number of children: Not on file  . Years of education: Not on file  . Highest education level: Not on file  Social Needs  . Financial resource strain: Not on file  . Food insecurity - worry: Not on file  . Food insecurity - inability: Not on file  . Transportation needs - medical: Not on file  . Transportation  needs - non-medical: Not on file  Occupational History  . Not on file  Tobacco Use  . Smoking status: Current Every Day Smoker    Types: Cigarettes  . Smokeless tobacco: Current User  Substance and Sexual Activity  . Alcohol use: Yes    Comment: occ  . Drug use: Yes    Types: Marijuana    Comment: stopped 2-3 mths ago  05/2016  . Sexual activity: Yes    Partners: Female  Other Topics Concern  . Not on file  Social History Narrative  . Not on file    Family History  Problem Relation Age of Onset  . Urticaria Mother   . Angioedema Brother   . Asthma Neg Hx   . Allergic rhinitis Neg Hx   . Eczema Neg Hx   . Immunodeficiency Neg Hx     Past Surgical History:  Procedure Laterality Date  . surgery on genitals    . tracheosotmy      ROS: Review of Systems Neg except as above PHYSICAL EXAM: BP 116/79 (BP Location: Right Arm, Patient Position: Sitting, Cuff Size: Large)   Pulse 63   Temp 97.6 F (36.4 C) (Oral)   Ht 6' (1.829 m)   Wt 213 lb 6.4 oz (96.8 kg)   SpO2 99%   BMI 28.94 kg/m  Physical Exam  General appearance - alert, well appearing, and in no distress Mental status - alert, oriented to person, place, and time, normal mood, behavior, speech, dress, motor activity, and thought processes Neck - supple, no significant adenopathy Chest - clear to auscultation, no wheezes, rales or rhonchi, symmetric air entry Heart - normal rate, regular rhythm, normal S1, S2, no murmurs, rubs, clicks or gallops Extremities - peripheral pulses normal, no pedal edema, no clubbing or cyanosis  ASSESSMENT AND PLAN: 1. Essential hypertension - cloNIDine (CATAPRES) 0.2 MG tablet; Take 1 tablet (0.2 mg total) by mouth 2 (two) times daily.  Dispense: 1 tablet; Refill: 5 - amLODipine (NORVASC) 10 MG tablet; Take 1 tablet (10 mg total) by mouth daily.  Dispense: 30 tablet; Refill: 6  2. Tobacco dependence  Patient advised to quit smoking. Discussed health risks associated with  smoking including lung and other types of cancers, chronic lung diseases and CV risks.. Pt ready/not ready to give trail of quitting.  Discussed methods to help quit including quitting cold [place], use of NRT, Chantix and Bupropion.  Ptt would like to try the nicotine gum Less than 5 mins spent on counseling  - nicotine polacrilex (NICORETTE) 2 MG gum; Take 1 each (2 mg total) by mouth as needed for smoking cessation.  Dispense: 100 tablet; Refill: 0  3. Need for Tdap vaccination - given at pharmacy  Pt declines flu shot Patient was given the opportunity to ask questions.  Patient verbalized understanding of the plan and was able to repeat key elements of the plan.   No orders of the defined types were placed in this encounter.    Requested Prescriptions    No prescriptions requested or ordered in this encounter    No Follow-up on file.  Karle Plumber, MD, FACP

## 2017-05-23 IMAGING — DX DG NECK SOFT TISSUE
2 series · 3 of 3 positions shown · non-contrast
Comparison: 08/15/2016, 07/02/2016

CLINICAL DATA: Swollen throat and tongue

EXAM:
NECK SOFT TISSUES - 1+ VIEW

[Series 1: neck lat · 0.14mm/px · 2 of 2 slices shown]
[im 1/2]
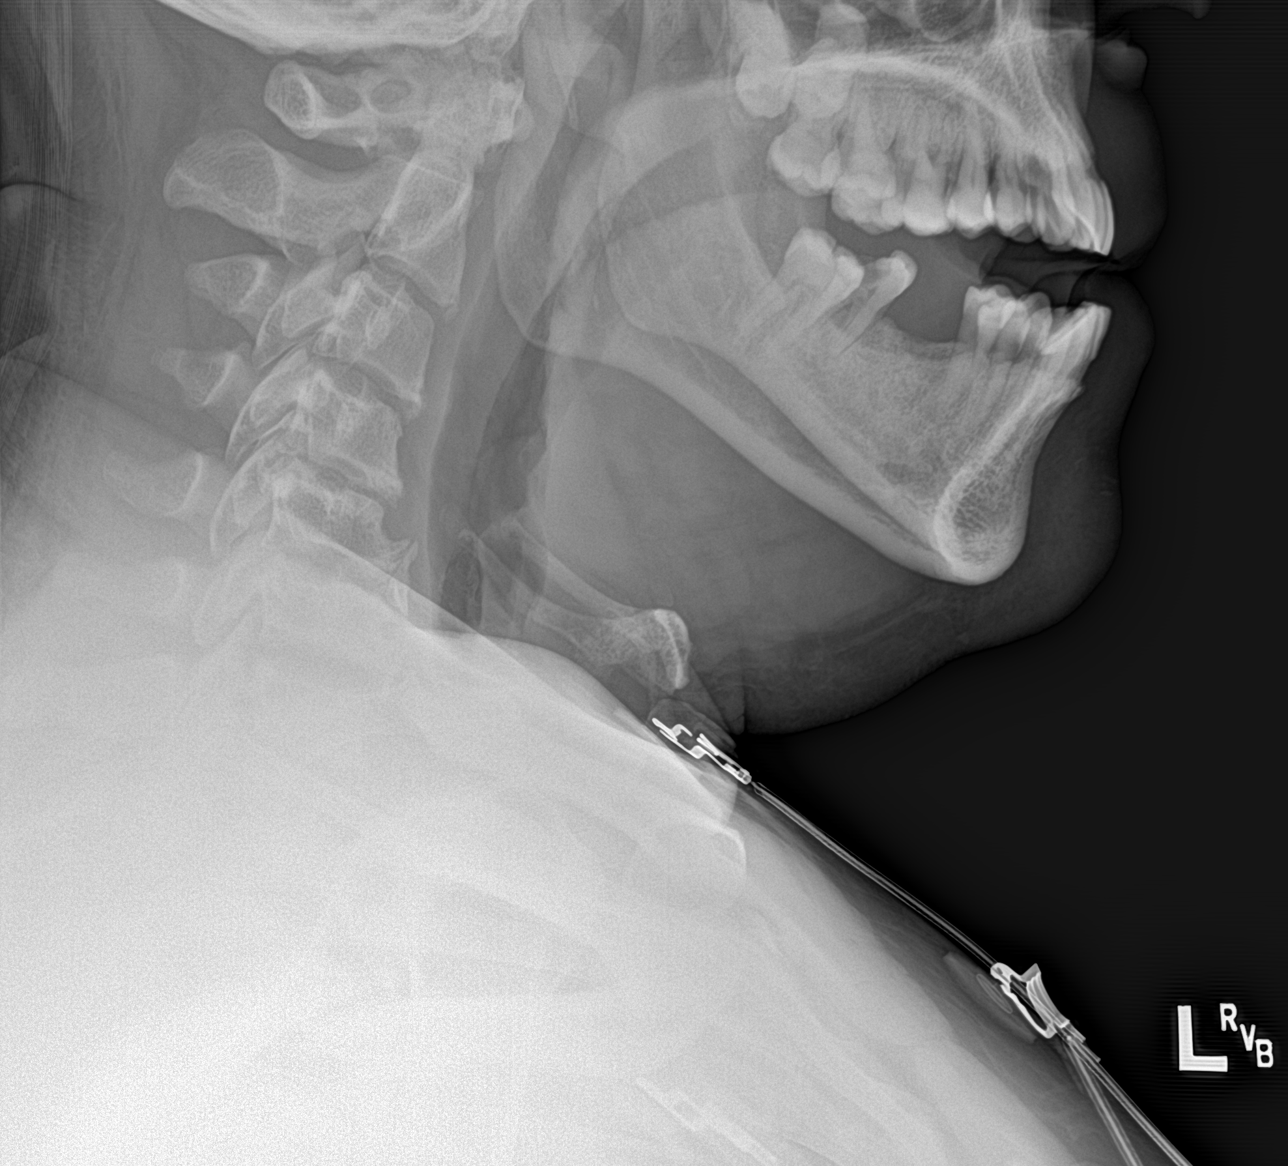
[im 2/2]
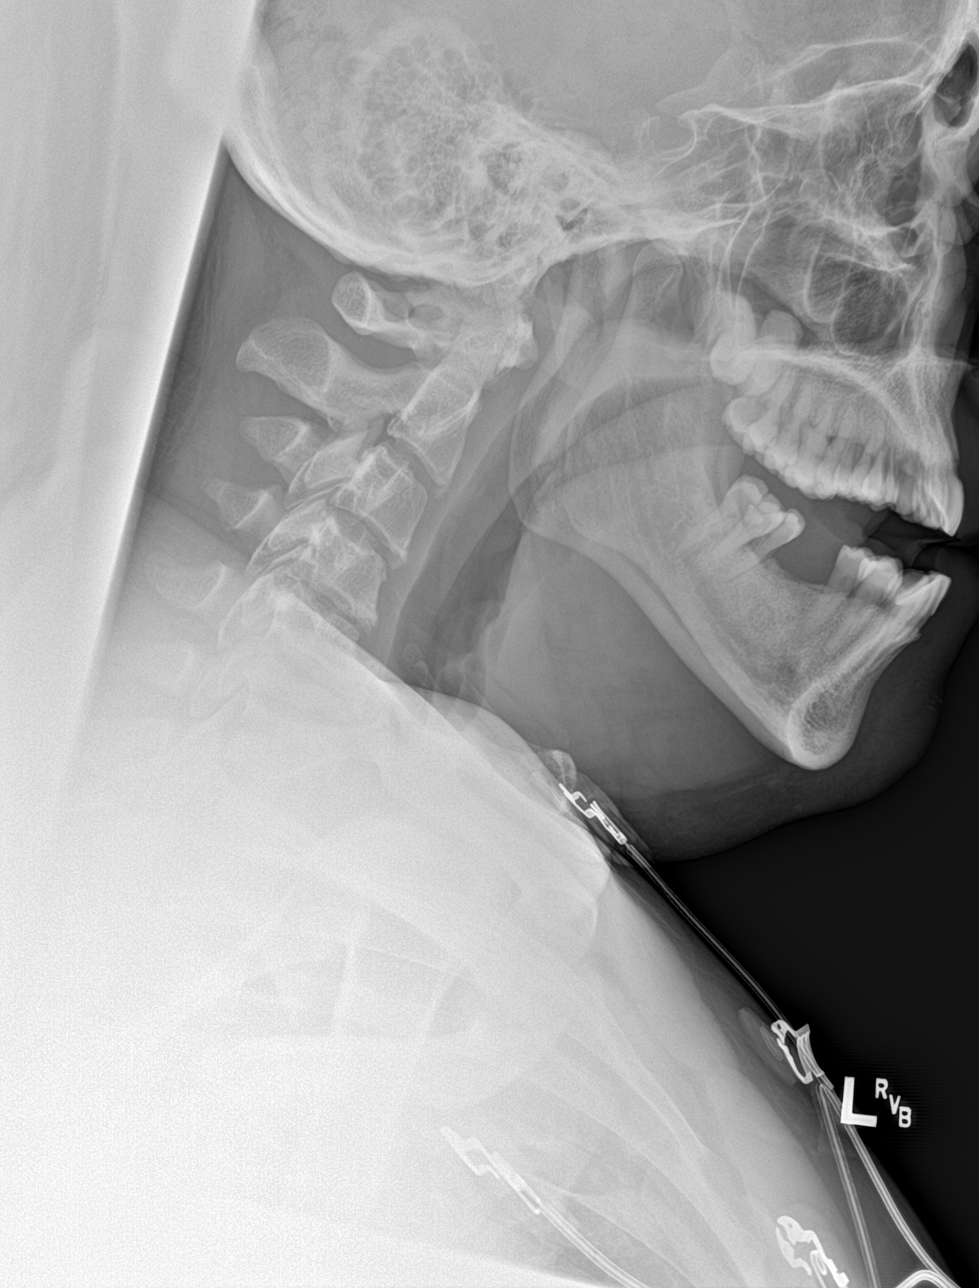

[neck ap]
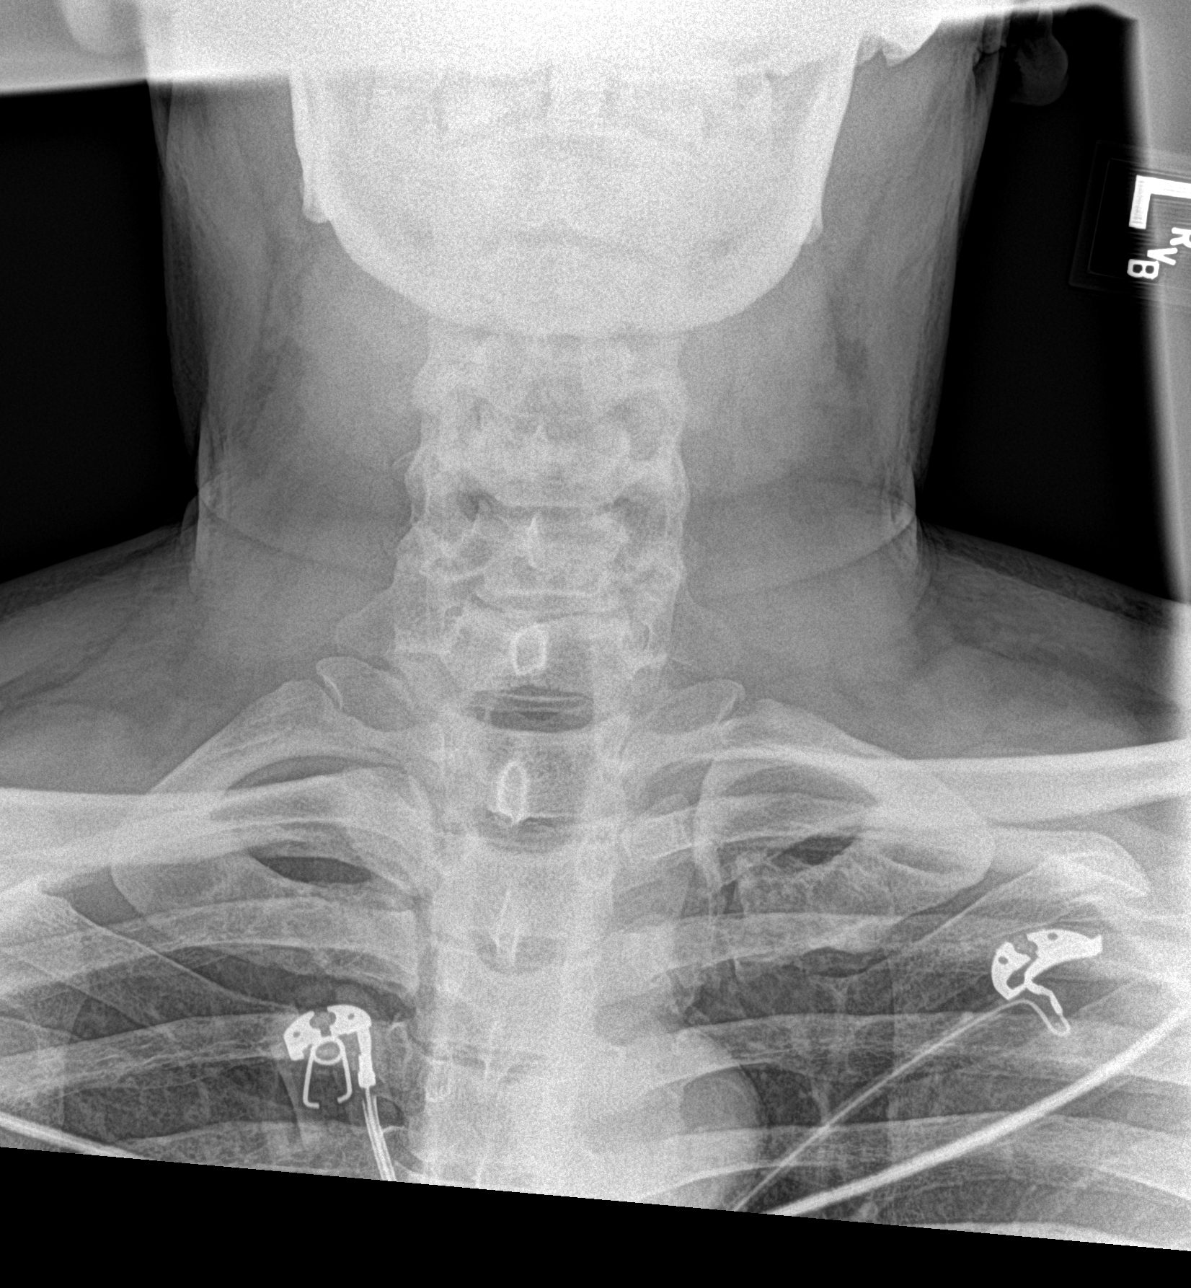

[3 of 3 positions shown; findings below may reference images not displayed]

FINDINGS: Reversal of cervical lordosis. Prevertebral soft tissue thickness
appears normal. Enlargement of the submandibular soft tissues.
Suboptimal visualization of subglottic trachea and epiglottis.
IMPRESSION: Enlargement of the submandibular soft tissues. Prevertebral soft
tissue thickness appears normal. Suboptimal visualization of the
subglottic trachea

## 2017-06-03 ENCOUNTER — Ambulatory Visit: Payer: Commercial Managed Care - PPO

## 2017-08-26 ENCOUNTER — Other Ambulatory Visit: Payer: Self-pay

## 2017-08-26 ENCOUNTER — Emergency Department (HOSPITAL_COMMUNITY)
Admission: EM | Admit: 2017-08-26 | Discharge: 2017-08-27 | Disposition: A | Discharge status: Home / Self Care | Payer: Commercial Managed Care - PPO | Attending: Emergency Medicine | Admitting: Emergency Medicine

## 2017-08-26 ENCOUNTER — Encounter (HOSPITAL_COMMUNITY): Payer: Self-pay | Admitting: Emergency Medicine

## 2017-08-26 DIAGNOSIS — F17210 Nicotine dependence, cigarettes, uncomplicated: Secondary | ICD-10-CM | POA: Diagnosis not present

## 2017-08-26 DIAGNOSIS — R600 Localized edema: Secondary | ICD-10-CM | POA: Diagnosis present

## 2017-08-26 DIAGNOSIS — I10 Essential (primary) hypertension: Secondary | ICD-10-CM | POA: Diagnosis not present

## 2017-08-26 DIAGNOSIS — Z79899 Other long term (current) drug therapy: Secondary | ICD-10-CM | POA: Insufficient documentation

## 2017-08-26 DIAGNOSIS — T783XXA Angioneurotic edema, initial encounter: Secondary | ICD-10-CM | POA: Diagnosis not present

## 2017-08-26 MED ORDER — DEXAMETHASONE SODIUM PHOSPHATE 10 MG/ML IJ SOLN
10.0000 mg | Freq: Once | INTRAMUSCULAR | Status: DC
  Filled 2017-08-26: qty 1

## 2017-08-26 MED ORDER — DIPHENHYDRAMINE HCL 50 MG/ML IJ SOLN
50.0000 mg | Freq: Once | INTRAMUSCULAR | Status: AC
  Administered 2017-08-26: 50 mg via INTRAVENOUS
  Filled 2017-08-26: qty 1

## 2017-08-26 MED ORDER — FAMOTIDINE IN NACL 20-0.9 MG/50ML-% IV SOLN
20.0000 mg | Freq: Once | INTRAVENOUS | Status: AC
  Administered 2017-08-26: 20 mg via INTRAVENOUS
  Filled 2017-08-26: qty 50

## 2017-08-26 NOTE — ED Provider Notes (Signed)
[facility] DEPT Provider Note: Georgena Spurling, MD, FACEP  CSN: [id number] MRN: [id number] ARRIVAL: 08/26/17 at 2157 ROOM: RESB/RESB   CHIEF COMPLAINT  Allergic Reaction   HISTORY OF PRESENT ILLNESS  08/26/17 11:38 PM Robert Reyes is a 52 y.o. male with a history of angioedema not on an ACE inhibitor or angiotensin receptor blocker.  He is here with edema of the lower lip that began about 4 PM after having a laceration to his left eyebrow repaired.  He suspects the injected lidocaine triggered the reaction.  His lip is moderately swollen but without pain.  He is having no swelling of the tongue or throat.  He is having no difficulty breathing.  He was given 2 shots, presumably of steroids, soon afterwards.  He also took a dose of Benadryl about 6:30 PM.  Despite this his swelling persists but is not worsening.    Past Medical History:  Diagnosis Date  . Angioedema   . Hypertension     Past Surgical History:  Procedure Laterality Date  . surgery on genitals    . tracheosotmy      Family History  Problem Relation Age of Onset  . Urticaria Mother   . Angioedema Brother   . Asthma Neg Hx   . Allergic rhinitis Neg Hx   . Eczema Neg Hx   . Immunodeficiency Neg Hx     Social History   Tobacco Use  . Smoking status: Current Every Day Smoker    Types: Cigarettes  . Smokeless tobacco: Current User  Substance Use Topics  . Alcohol use: Yes    Comment: occ  . Drug use: Yes    Types: Marijuana    Comment: stopped 2-3 mths ago  05/2016    Prior to Admission medications   Medication Sig Start Date End Date Taking? Authorizing Provider  amLODipine (NORVASC) 10 MG tablet Take 1 tablet (10 mg total) by mouth daily. 05/16/17  Yes Ladell Pier, MD  cloNIDine (CATAPRES) 0.2 MG tablet Take 1 tablet (0.2 mg total) by mouth 2 (two) times daily. 05/16/17  Yes Ladell Pier, MD  nicotine polacrilex (NICORETTE) 2 MG gum Take 1 each (2 mg total) by mouth as needed for  smoking cessation. 05/16/17  Yes Ladell Pier, MD  cetirizine (ZYRTEC) 10 MG tablet Take 1 tablet (10 mg total) by mouth 2 (two) times daily. Patient not taking: Reported on 10/01/2016 08/26/16   Aline August, MD    Allergies Lisinopril and Ace inhibitors   REVIEW OF SYSTEMS  Negative except as noted here or in the History of Present Illness.   PHYSICAL EXAMINATION  Initial Vital Signs Blood pressure (!) 136/101, pulse 82, temperature 98.3 F (36.8 C), temperature source Oral, resp. rate 18, height 6' (1.829 m), weight 93 kg (205 lb), SpO2 99 %.  Examination General: Well-developed, well-nourished male in no acute distress; appearance consistent with age of record HENT: normocephalic; sutured laceration left eyebrow; edema of lower lip without edema of tongue or pharynx; normal voice; no stridor Eyes: pupils equal, round and reactive to light; extraocular muscles intact; arcus senilis bilaterally Neck: supple Heart: regular rate and rhythm Lungs: clear to auscultation bilaterally Abdomen: soft; nondistended; nontender; no masses or hepatosplenomegaly; bowel sounds present Extremities: No deformity; full range of motion; pulses normal Neurologic: Awake, alert and oriented; motor function intact in all extremities and symmetric; no facial droop Skin: Warm and dry Psychiatric: Normal mood and affect   RESULTS  Summary of this visit's results,  reviewed by myself:   EKG Interpretation  Date/Time:    Ventricular Rate:    PR Interval:    QRS Duration:   QT Interval:    QTC Calculation:   R Axis:     Text Interpretation:        Laboratory Studies: No results found for this or any previous visit (from the past 24 hour(s)). Imaging Studies: No results found.  ED COURSE and MDM  Nursing notes and initial vitals signs, including pulse oximetry, reviewed.  Vitals:   08/26/17 2204 08/26/17 2232 08/27/17 0224 08/27/17 0447  BP: (!) 136/101  (!) 131/95 (!) 125/94    Pulse: 82  75 71  Resp: 18  19 16   Temp: 98.3 F (36.8 C)     TempSrc: Oral     SpO2: 99%  96% 95%  Weight:  93 kg (205 lb)    Height:  6' (1.829 m)     11:49 PM IV Benadryl and Pepcid ordered.  5:03 AM Patient's lip is somewhat less swollen than earlier.  He feels good and is having no difficulty breathing.  I believe he is at this time.  He was advised to call 9 1 would should swelling worsen especially if there is throat or tongue swelling.  PROCEDURES    ED DIAGNOSES     ICD-10-CM   1. Angioedema, initial encounter T78.Mechele Dawley, MD 08/27/17 [phone]

## 2017-08-26 NOTE — ED Triage Notes (Signed)
Patient complaining of an allergic reaction. Patient lips is swollen and waxy. Patient states that he was allergic to the lidocaine that he got earlier.

## 2017-09-07 ENCOUNTER — Encounter (HOSPITAL_COMMUNITY): Payer: Self-pay

## 2017-09-07 ENCOUNTER — Emergency Department (HOSPITAL_COMMUNITY): Payer: Commercial Managed Care - PPO | Admitting: Anesthesiology

## 2017-09-07 ENCOUNTER — Inpatient Hospital Stay (HOSPITAL_COMMUNITY)
Admission: EM | Admit: 2017-09-07 | Discharge: 2017-09-10 | DRG: 915 | Disposition: A | Discharge status: Home / Self Care | Payer: Commercial Managed Care - PPO | Attending: Pulmonary Disease | Admitting: Pulmonary Disease

## 2017-09-07 ENCOUNTER — Emergency Department (HOSPITAL_COMMUNITY): Payer: Commercial Managed Care - PPO

## 2017-09-07 ENCOUNTER — Other Ambulatory Visit: Payer: Self-pay

## 2017-09-07 DIAGNOSIS — D72829 Elevated white blood cell count, unspecified: Secondary | ICD-10-CM | POA: Diagnosis not present

## 2017-09-07 DIAGNOSIS — T380X5A Adverse effect of glucocorticoids and synthetic analogues, initial encounter: Secondary | ICD-10-CM | POA: Diagnosis not present

## 2017-09-07 DIAGNOSIS — X58XXXA Exposure to other specified factors, initial encounter: Secondary | ICD-10-CM | POA: Diagnosis present

## 2017-09-07 DIAGNOSIS — Z888 Allergy status to other drugs, medicaments and biological substances status: Secondary | ICD-10-CM

## 2017-09-07 DIAGNOSIS — Z79899 Other long term (current) drug therapy: Secondary | ICD-10-CM

## 2017-09-07 DIAGNOSIS — T783XXA Angioneurotic edema, initial encounter: Principal | ICD-10-CM | POA: Diagnosis present

## 2017-09-07 DIAGNOSIS — F17210 Nicotine dependence, cigarettes, uncomplicated: Secondary | ICD-10-CM | POA: Diagnosis present

## 2017-09-07 DIAGNOSIS — I10 Essential (primary) hypertension: Secondary | ICD-10-CM | POA: Diagnosis present

## 2017-09-07 DIAGNOSIS — J9600 Acute respiratory failure, unspecified whether with hypoxia or hypercapnia: Secondary | ICD-10-CM | POA: Diagnosis present

## 2017-09-07 DIAGNOSIS — R739 Hyperglycemia, unspecified: Secondary | ICD-10-CM | POA: Diagnosis not present

## 2017-09-07 DIAGNOSIS — E875 Hyperkalemia: Secondary | ICD-10-CM | POA: Diagnosis present

## 2017-09-07 DIAGNOSIS — R0602 Shortness of breath: Secondary | ICD-10-CM | POA: Diagnosis not present

## 2017-09-07 LAB — CBC WITH DIFFERENTIAL/PLATELET
Basophils Absolute: 0 10*3/uL (ref 0.0–0.1)
Basophils Relative: 0 %
Eosinophils Absolute: 0.2 10*3/uL (ref 0.0–0.7)
Eosinophils Relative: 2 %
HEMATOCRIT: 38.4 % — AB (ref 39.0–52.0)
HEMOGLOBIN: 12.9 g/dL — AB (ref 13.0–17.0)
LYMPHS ABS: 4 10*3/uL (ref 0.7–4.0)
Lymphocytes Relative: 38 %
MCH: 31 pg (ref 26.0–34.0)
MCHC: 33.6 g/dL (ref 30.0–36.0)
MCV: 92.3 fL (ref 78.0–100.0)
MONOS PCT: 6 %
Monocytes Absolute: 0.6 10*3/uL (ref 0.1–1.0)
NEUTROS ABS: 5.6 10*3/uL (ref 1.7–7.7)
NEUTROS PCT: 54 %
Platelets: 239 10*3/uL (ref 150–400)
RBC: 4.16 MIL/uL — ABNORMAL LOW (ref 4.22–5.81)
RDW: 14.2 % (ref 11.5–15.5)
WBC: 10.4 10*3/uL (ref 4.0–10.5)

## 2017-09-07 LAB — MRSA PCR SCREENING: MRSA by PCR: NEGATIVE

## 2017-09-07 LAB — BLOOD GAS, ARTERIAL
Acid-base deficit: 3.3 mmol/L — ABNORMAL HIGH (ref 0.0–2.0)
Bicarbonate: 21.1 mmol/L (ref 20.0–28.0)
Drawn by: 441261
FIO2: 100
MECHVT: 570 mL
O2 Saturation: 99.5 %
PATIENT TEMPERATURE: 98.6
PCO2 ART: 38.1 mmHg (ref 32.0–48.0)
PEEP: 5 cmH2O
RATE: 15 resp/min
pH, Arterial: 7.362 (ref 7.350–7.450)
pO2, Arterial: 424 mmHg — ABNORMAL HIGH (ref 83.0–108.0)

## 2017-09-07 LAB — BASIC METABOLIC PANEL
ANION GAP: 6 (ref 5–15)
BUN: 13 mg/dL (ref 6–20)
CO2: 26 mmol/L (ref 22–32)
Calcium: 9.2 mg/dL (ref 8.9–10.3)
Chloride: 110 mmol/L (ref 101–111)
Creatinine, Ser: 1 mg/dL (ref 0.61–1.24)
GFR calc non Af Amer: >60 mL/min (ref >60)
Glucose, Bld: 107 mg/dL — ABNORMAL HIGH (ref 65–99)
Potassium: 4 mmol/L (ref 3.5–5.1)
Sodium: 142 mmol/L (ref 135–145)

## 2017-09-07 LAB — TRIGLYCERIDES: TRIGLYCERIDES: 77 mg/dL (ref <150)

## 2017-09-07 MED ORDER — ONDANSETRON HCL 4 MG/2ML IJ SOLN: 4.0000 mg | Freq: Four times a day (QID) | INTRAMUSCULAR | Status: DC | PRN

## 2017-09-07 MED ORDER — LABETALOL HCL 5 MG/ML IV SOLN
10.0000 mg | INTRAVENOUS | Status: DC | PRN
  Administered 2017-09-07 – 2017-09-09 (×2): 10 mg via INTRAVENOUS
  Filled 2017-09-07 (×3): qty 4

## 2017-09-07 MED ORDER — SODIUM CHLORIDE 0.9 % IV BOLUS
1000.0000 mL | Freq: Once | INTRAVENOUS | Status: AC
  Administered 2017-09-07: 1000 mL via INTRAVENOUS

## 2017-09-07 MED ORDER — FENTANYL CITRATE (PF) 100 MCG/2ML IJ SOLN
50.0000 ug | Freq: Once | INTRAMUSCULAR | Status: AC
Start: 2017-09-07 — End: 2017-09-07
  Administered 2017-09-07: 50 ug via INTRAVENOUS
  Filled 2017-09-07: qty 2

## 2017-09-07 MED ORDER — FENTANYL CITRATE (PF) 100 MCG/2ML IJ SOLN
100.0000 ug | INTRAMUSCULAR | Status: DC | PRN
  Administered 2017-09-07: 100 ug via INTRAVENOUS
  Filled 2017-09-07: qty 2

## 2017-09-07 MED ORDER — DIPHENHYDRAMINE HCL 50 MG/ML IJ SOLN
25.0000 mg | Freq: Once | INTRAMUSCULAR | Status: AC
  Administered 2017-09-07: 25 mg via INTRAVENOUS
  Filled 2017-09-07: qty 1

## 2017-09-07 MED ORDER — EPINEPHRINE 0.3 MG/0.3ML IJ SOAJ
0.3000 mg | Freq: Once | INTRAMUSCULAR | Status: AC
  Administered 2017-09-07: 0.3 mg via INTRAMUSCULAR
  Filled 2017-09-07: qty 0.3

## 2017-09-07 MED ORDER — LACTATED RINGERS IV SOLN
INTRAVENOUS | Status: DC
  Administered 2017-09-07 – 2017-09-09 (×2): via INTRAVENOUS

## 2017-09-07 MED ORDER — FENTANYL CITRATE (PF) 100 MCG/2ML IJ SOLN
100.0000 ug | INTRAMUSCULAR | Status: AC | PRN
  Administered 2017-09-07 (×3): 100 ug via INTRAVENOUS
  Filled 2017-09-07 (×3): qty 2

## 2017-09-07 MED ORDER — DIPHENHYDRAMINE HCL 50 MG/ML IJ SOLN
50.0000 mg | Freq: Four times a day (QID) | INTRAMUSCULAR | Status: AC
  Administered 2017-09-07 – 2017-09-08 (×4): 50 mg via INTRAVENOUS
  Filled 2017-09-07 (×4): qty 1

## 2017-09-07 MED ORDER — FENTANYL CITRATE (PF) 100 MCG/2ML IJ SOLN
100.0000 ug | Freq: Once | INTRAMUSCULAR | Status: AC
  Administered 2017-09-07: 100 ug via INTRAVENOUS
  Filled 2017-09-07: qty 2

## 2017-09-07 MED ORDER — SODIUM CHLORIDE 0.9 % IV SOLN
250.0000 mL | INTRAVENOUS | Status: DC | PRN
Start: 2017-09-07 — End: 2017-09-10

## 2017-09-07 MED ORDER — PROPOFOL 1000 MG/100ML IV EMUL
INTRAVENOUS | Status: AC
  Administered 2017-09-07: 10 ug/kg/min via INTRAVENOUS
  Filled 2017-09-07: qty 100

## 2017-09-07 MED ORDER — PROPOFOL 1000 MG/100ML IV EMUL
0.0000 ug/kg/min | INTRAVENOUS | Status: DC
  Administered 2017-09-07: 30 ug/kg/min via INTRAVENOUS
  Administered 2017-09-07: 50 ug/kg/min via INTRAVENOUS
  Administered 2017-09-07: 10 ug/kg/min via INTRAVENOUS
  Administered 2017-09-08 – 2017-09-09 (×3): 20 ug/kg/min via INTRAVENOUS
  Filled 2017-09-07 (×7): qty 100

## 2017-09-07 MED ORDER — FENTANYL BOLUS VIA INFUSION
50.0000 ug | INTRAVENOUS | Status: DC | PRN
  Administered 2017-09-07 – 2017-09-08 (×2): 50 ug via INTRAVENOUS
  Filled 2017-09-07: qty 50

## 2017-09-07 MED ORDER — ACETAMINOPHEN 325 MG PO TABS: 650.0000 mg | ORAL_TABLET | ORAL | Status: DC | PRN

## 2017-09-07 MED ORDER — FAMOTIDINE IN NACL 20-0.9 MG/50ML-% IV SOLN
20.0000 mg | Freq: Once | INTRAVENOUS | Status: AC
  Administered 2017-09-07: 20 mg via INTRAVENOUS
  Filled 2017-09-07: qty 50

## 2017-09-07 MED ORDER — SUCCINYLCHOLINE CHLORIDE 20 MG/ML IJ SOLN
INTRAMUSCULAR | Status: DC | PRN
  Administered 2017-09-07: 100 mg via INTRAVENOUS

## 2017-09-07 MED ORDER — FENTANYL 2500MCG IN NS 250ML (10MCG/ML) PREMIX INFUSION
25.0000 ug/h | INTRAVENOUS | Status: DC
  Administered 2017-09-07: 50 ug/h via INTRAVENOUS
  Filled 2017-09-07 (×2): qty 250

## 2017-09-07 MED ORDER — DEXAMETHASONE SODIUM PHOSPHATE 10 MG/ML IJ SOLN
10.0000 mg | Freq: Four times a day (QID) | INTRAMUSCULAR | Status: DC
  Administered 2017-09-07 – 2017-09-08 (×3): 10 mg via INTRAVENOUS
  Filled 2017-09-07 (×3): qty 1

## 2017-09-07 MED ORDER — FENTANYL CITRATE (PF) 100 MCG/2ML IJ SOLN: 50.0000 ug | Freq: Once | INTRAMUSCULAR | Status: AC

## 2017-09-07 MED ORDER — METHYLPREDNISOLONE SODIUM SUCC 125 MG IJ SOLR
125.0000 mg | Freq: Once | INTRAMUSCULAR | Status: AC
  Administered 2017-09-07: 125 mg via INTRAVENOUS
  Filled 2017-09-07: qty 2

## 2017-09-07 MED ORDER — FAMOTIDINE IN NACL 20-0.9 MG/50ML-% IV SOLN
20.0000 mg | Freq: Two times a day (BID) | INTRAVENOUS | Status: DC
  Administered 2017-09-07 – 2017-09-10 (×5): 20 mg via INTRAVENOUS
  Filled 2017-09-07 (×5): qty 50

## 2017-09-07 MED ORDER — ETOMIDATE 2 MG/ML IV SOLN
INTRAVENOUS | Status: DC | PRN
  Administered 2017-09-07: 20 mg via INTRAVENOUS

## 2017-09-07 MED ORDER — C1 ESTERASE INHIBITOR (HUMAN) 500 UNITS IV KIT
20.0000 [IU]/kg | PACK | Freq: Once | INTRAVENOUS | Status: AC
  Administered 2017-09-07: 2000 [IU] via INTRAVENOUS
  Filled 2017-09-07: qty 2000

## 2017-09-07 MED ORDER — ALBUTEROL SULFATE (2.5 MG/3ML) 0.083% IN NEBU: 2.5000 mg | INHALATION_SOLUTION | RESPIRATORY_TRACT | Status: DC | PRN

## 2017-09-07 MED ORDER — ENOXAPARIN SODIUM 40 MG/0.4ML ~~LOC~~ SOLN
40.0000 mg | SUBCUTANEOUS | Status: DC
  Administered 2017-09-07 – 2017-09-09 (×3): 40 mg via SUBCUTANEOUS
  Filled 2017-09-07 (×3): qty 0.4

## 2017-09-07 NOTE — H&P (Signed)
PULMONARY / CRITICAL CARE MEDICINE   Name: Robert Reyes MRN: [id number] DOB: 1966-03-28    ADMISSION DATE:  09/07/2017  REFERRING MD:  EDP   CHIEF COMPLAINT:  Angioedema   HISTORY OF PRESENT ILLNESS:   52yo male with hx HTN and previous angioedema no longer on ACE or ARB who presented 6/5 with tongue swelling and dyspnea.  Of note he was just seen 5/24 with lip swelling which began after having a laceration to his eyebrow repair.  It was assumed that he was having a reaction to the lidocaine.  He was given steroids, benadryl, pepcid and improved quickly and was sent home with resolution of his symptoms at that time.  On 6/5 he returned with tongue, throat and neck swelling.  No new medications, foods or other known exposures.  In ER he had worsening dyspnea and swelling and decision was made to intubate.  Found to have marked swelling of tongue and upper airway.  Patient indicates this is pattern similar to prior.  PCCM called for admission.   PAST MEDICAL HISTORY :  He  has a past medical history of Angioedema and Hypertension.  Evaluated previously by allergist: all complement levels normal. No triggers identified.  PAST SURGICAL HISTORY: He  has a past surgical history that includes tracheosotmy and surgery on genitals.  Allergies  Allergen Reactions  . Lisinopril Swelling    Angioedema with Lisinopril/ HCTZ  . Ace Inhibitors Swelling    Angioedema    No current facility-administered medications on file prior to encounter.    Current Outpatient Medications on File Prior to Encounter  Medication Sig  . amLODipine (NORVASC) 10 MG tablet Take 1 tablet (10 mg total) by mouth daily.  . cloNIDine (CATAPRES) 0.2 MG tablet Take 1 tablet (0.2 mg total) by mouth 2 (two) times daily.  . nicotine polacrilex (NICORETTE) 2 MG gum Take 1 each (2 mg total) by mouth as needed for smoking cessation.    FAMILY HISTORY:  His indicated that his mother is alive. He indicated that his father  is alive. He indicated that his brother is alive. He indicated that the status of his neg hx is unknown.   SOCIAL HISTORY: He  reports that he has been smoking cigarettes.  He uses smokeless tobacco. He reports that he drinks alcohol. He reports that he has current or past drug history. Drug: Marijuana.  REVIEW OF SYSTEMS:   Review of Systems  Constitutional: Negative.   Eyes: Negative.   Respiratory: Positive for stridor. Negative for shortness of breath and wheezing.   Cardiovascular: Negative.   Gastrointestinal: Negative.   Musculoskeletal: Negative.   Skin: Negative.  Negative for rash.  Neurological: Negative.   Endo/Heme/Allergies: Negative.   Psychiatric/Behavioral: Negative.    SUBJECTIVE:  Patient denies any discomfort at this time.  VITAL SIGNS: BP (!) 170/109   Pulse (!) 112   Resp 19   SpO2 100%   HEMODYNAMICS:  on no vasoactive infusion.  VENTILATOR SETTINGS:   Vent Mode: PRVC FiO2 (%):  [100 %] 100 % Set Rate:  [15 bmp] 15 bmp Vt Set:  [540 mL-570 mL] 570 mL PEEP:  [5 cmH20] 5 cmH20  INTAKE / OUTPUT: No intake/output data recorded.  PHYSICAL EXAMINATION: General:  Well built well nourished man.  Neuro:  Lightly sedated. No focal deficits HEENT:  ETT in place. No visible lip swelling. Normal dentition. Firmness left lateral submandibular space. Cardiovascular:  S1,S2 normal no edema, extremities are warm. Lungs:  Clear with no wheezes.  No ventilator asynchrony.  Normal ventilator flow curves. Abdomen:  Soft non-tender. Musculoskeletal:  No swelling  Skin:  No rashes.  LABS:  BMET Recent Labs  Lab 09/07/17 1245  NA 142  K 4.0  CL 110  CO2 26  BUN 13  CREATININE 1.00  GLUCOSE 107*    Electrolytes Recent Labs  Lab 09/07/17 1245  CALCIUM 9.2    CBC Recent Labs  Lab 09/07/17 1245  WBC 10.4  HGB 12.9*  HCT 38.4*  PLT 239    Coag's No results for input(s): APTT, INR in the last 168 hours.  Sepsis Markers No results for  input(s): LATICACIDVEN, PROCALCITON, O2SATVEN in the last 168 hours.  ABG No results for input(s): PHART, PCO2ART, PO2ART in the last 168 hours.  Liver Enzymes No results for input(s): AST, ALT, ALKPHOS, BILITOT, ALBUMIN in the last 168 hours.  Cardiac Enzymes No results for input(s): TROPONINI, PROBNP in the last 168 hours.  Glucose No results for input(s): GLUCAP in the last 168 hours.  Imaging Dg Chest Port 1 View  Result Date: 09/07/2017 CLINICAL DATA:  Shortness of breath EXAM: PORTABLE CHEST 1 VIEW COMPARISON:  08/24/2016 FINDINGS: Cardiac shadow is within normal limits. Endotracheal tube is noted in satisfactory position. The lungs are well aerated bilaterally. No focal infiltrate or sizable effusion is seen. No bony abnormality is noted. IMPRESSION: Endotracheal tube in satisfactory position. No acute abnormality seen. Electronically Signed   By: Inez Catalina M.D.   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   No clear trigger despite prior episodes. Possible familial component.   ASSESSMENT / PLAN:  Acute respiratory failure - r/t airway compromise in the setting of angioedema  Tobacco abuse  PLAN -  Vent support - 8cc/kg  F/u CXR  F/u ABG Daily SBT  Check cuff leak prior to extubation  Smoking cessation   Angioedema -- has hx of angioedema as well. Some ?as to whether this is familial inherited angioedema.  Not on ACE or ARB.  Recent angioedema 5/24 after lidocaine given for sutures. He is a smoker as well.  ?reaction to cigarette additive.  PLAN -  Dexamethasone Pepcid  Benadryl  Airway protection as above    Hx HTN  PLAN -  Hold outpt norvasc, clonidine for now   DVT - Lovenox SUP - pepcid   Nutrition - NPO   FAMILY  - Updates:   - Inter-disciplinary family meet or Palliative Care meeting due by:  day 7  CRITICAL CARE Performed by: Kipp Brood  Total critical care time: 40 minutes  Critical care time was exclusive of separately billable procedures and treating other patients.  Critical care was necessary to treat or prevent imminent or life-threatening deterioration.  Critical care was time spent personally by me on the following activities: development of treatment plan with patient and/or surrogate as well as nursing, discussions with consultants, evaluation of patient's response to treatment, examination of patient, obtaining history from patient or surrogate, ordering and performing treatments and interventions, ordering and review of laboratory studies, ordering and review of radiographic studies, pulse oximetry and re-evaluation of patient's condition.   Kipp Brood, MD [facility] Pager [phone] After Hours  [phone] 

## 2017-09-07 NOTE — ED Provider Notes (Signed)
[facility] DEPT Provider Note   CSN: [id number] Arrival date & time: 09/07/17  1237     History   Chief Complaint Chief Complaint  Patient presents with  . Angioedema    HPI Daxton Nydam is a 52 y.o. male, history of angioedema, hypertension who presents for evaluation of throat and neck swelling that began approximately 1 hour prior to ED arrival.  Patient reports that he was at home when he started experiencing symptoms acute.  Patient has a history of angioedema.  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  He has had reactions previously before.  Initially, they thought patient was allergic to lisinopril.  Additionally, patient seemed to have had a reaction to lidocaine.  Patient had stitches done last week with lidocaine that resulted in the emergency department visit for evaluation.  Patient was discharged after observation at that time.  Patient states he has not had any of his known allergens.  He denies any eating any new or weird foods.  Denies any new exposures, lotions, soaps, detergents.  Patient reports that he feels like the left side of his tongue is swollen.  He also feels like his neck is swelling.  He reports some mild difficulty breathing associated with symptoms.  Patient denies any chest pain, abdominal pain, vomiting.  The history is provided by the patient.    Past Medical History:  Diagnosis Date  . Angioedema   . Hypertension     Patient Active Problem List   Diagnosis Date Noted  . Allergic reaction 10/12/2016  . Tobacco dependence 08/20/2016  . Hypertension 07/22/2016  . Angioedema 07/02/2016    Past Surgical History:  Procedure Laterality Date  . surgery on genitals    . tracheosotmy          Home Medications    Prior to Admission medications   Medication Sig Start Date End Date Taking? Authorizing Provider  amLODipine (NORVASC) 10 MG tablet Take 1 tablet (10 mg total) by mouth daily.  05/16/17  Yes Ladell Pier, MD  cloNIDine (CATAPRES) 0.2 MG tablet Take 1 tablet (0.2 mg total) by mouth 2 (two) times daily. 05/16/17  Yes Ladell Pier, MD  diphenhydrAMINE (BENADRYL) 25 mg capsule Take 25 mg by mouth every 6 (six) hours as needed.   Yes [provider]  ibuprofen (ADVIL,MOTRIN) 800 MG tablet Take 800 mg by mouth every 6 (six) hours as needed for pain. 09/05/17  Yes [provider]  nicotine polacrilex (NICORETTE) 2 MG gum Take 1 each (2 mg total) by mouth as needed for smoking cessation. Patient not taking: Reported on 09/07/2017 05/16/17   Ladell Pier, MD    Family History Family History  Problem Relation Age of Onset  . Urticaria Mother   . Angioedema Brother   . Asthma Neg Hx   . Allergic rhinitis Neg Hx   . Eczema Neg Hx   . Immunodeficiency Neg Hx     Social History Social History   Tobacco Use  . Smoking status: Current Every Day Smoker    Types: Cigarettes  . Smokeless tobacco: Current User  Substance Use Topics  . Alcohol use: Yes    Comment: occ  . Drug use: Yes    Types: Marijuana    Comment: stopped 2-3 mths ago  05/2016     Allergies   Lidocaine; Lisinopril; and Ace inhibitors   Review of Systems Review of Systems  Constitutional: Negative for fever.  HENT: Positive for  facial swelling and trouble swallowing.   Respiratory: Positive for shortness of breath. Negative for cough.   Cardiovascular: Negative for chest pain.  Gastrointestinal: Negative for abdominal pain, nausea and vomiting.  Genitourinary: Negative for dysuria and hematuria.  Neurological: Negative for headaches.  All other systems reviewed and are negative.    Physical Exam Updated Vital Signs BP (!) 164/109   Pulse 87   Resp (!) 23   Ht 5\' 9"  (1.753 m)   SpO2 99%   BMI 30.27 kg/m   Physical Exam  Constitutional: He is oriented to person, place, and time. He appears well-developed and well-nourished.  Appears uncomfortable    HENT:  Head: Normocephalic and atraumatic.  Mouth/Throat: Oropharynx is clear and moist and mucous membranes are normal.  Airway patent. Edema noted to the left sided of tongue.  Muffled phonation (wife states that this is not his normal phonation.  Eyes: Pupils are equal, round, and reactive to light. Conjunctivae, EOM and lids are normal.  Neck: Full passive range of motion without pain.  Cardiovascular: Normal rate, regular rhythm, normal heart sounds and normal pulses. Exam reveals no gallop and no friction rub.  No murmur heard. Pulses:      Radial pulses are 2+ on the right side, and 2+ on the left side.  Pulmonary/Chest: Effort normal and breath sounds normal.  Lungs clear to auscultation bilaterally.  Symmetric chest rise.  No wheezing, rales, rhonchi.  Abdominal: Soft. Normal appearance. There is no tenderness. There is no rigidity and no guarding.  Abdomen is soft, non-distended, non-tender. No rigidity, No guarding. No peritoneal signs.  Musculoskeletal: Normal range of motion.  Neurological: He is alert and oriented to person, place, and time.  Skin: Skin is warm and dry. Capillary refill takes less than 2 seconds.  Psychiatric: He has a normal mood and affect. His speech is normal.  Nursing note and vitals reviewed.    ED Treatments / Results  Labs (all labs ordered are listed, but only abnormal results are displayed) Labs Reviewed  BASIC METABOLIC PANEL - Abnormal; Notable for the following components:      Result Value   Glucose, Bld 107 (*)    All other components within normal limits  CBC WITH DIFFERENTIAL/PLATELET - Abnormal; Notable for the following components:   RBC 4.16 (*)    Hemoglobin 12.9 (*)    HCT 38.4 (*)    All other components within normal limits  HIV ANTIBODY (ROUTINE TESTING)  CBC  CREATININE, SERUM  TRIGLYCERIDES  BLOOD GAS, ARTERIAL    EKG None  Radiology Dg Chest Port 1 View  Result Date: 09/07/2017 CLINICAL DATA:  Shortness of  breath EXAM: PORTABLE CHEST 1 VIEW COMPARISON:  08/24/2016 FINDINGS: Cardiac shadow is within normal limits. Endotracheal tube is noted in satisfactory position. The lungs are well aerated bilaterally. No focal infiltrate or sizable effusion is seen. No bony abnormality is noted. IMPRESSION: Endotracheal tube in satisfactory position. No acute abnormality seen. Electronically Signed   By: Inez Catalina M.D.   On: 09/07/2017 14:36    Procedures .Critical Care Performed by: Volanda Napoleon, PA-C Authorized by: Volanda Napoleon, PA-C   Critical care provider statement:    Critical care time (minutes):  35   Critical care time was exclusive of:  Separately billable procedures and treating other patients   Critical care was necessary to treat or prevent imminent or life-threatening deterioration of the following conditions:  Renal failure, cardiac failure, circulatory failure and respiratory failure  Critical care was time spent personally by me on the following activities:  Blood draw for specimens, ordering and performing treatments and interventions, ordering and review of laboratory studies, development of treatment plan with patient or surrogate, discussions with consultants, pulse oximetry, re-evaluation of patient's condition, evaluation of patient's response to treatment and examination of patient   (including critical care time)  Medications Ordered in ED Medications  C1 esterase inhibitor (Human) (BERINERT) injection 2,000 Units (has no administration in time range)  0.9 %  sodium chloride infusion (has no administration in time range)  enoxaparin (LOVENOX) injection 40 mg (has no administration in time range)  famotidine (PEPCID) IVPB 20 mg premix (has no administration in time range)  lactated ringers infusion (has no administration in time range)  acetaminophen (TYLENOL) tablet 650 mg (has no administration in time range)  ondansetron (ZOFRAN) injection 4 mg (has no administration  in time range)  albuterol (PROVENTIL) (2.5 MG/3ML) 0.083% nebulizer solution 2.5 mg (has no administration in time range)  dexamethasone (DECADRON) injection 10 mg (has no administration in time range)  diphenhydrAMINE (BENADRYL) injection 50 mg (has no administration in time range)  fentaNYL (SUBLIMAZE) injection 100 mcg (has no administration in time range)  fentaNYL (SUBLIMAZE) injection 100 mcg (has no administration in time range)  propofol (DIPRIVAN) 1000 MG/100ML infusion (30 mcg/kg/min  93 kg Intravenous Rate/Dose Change 09/07/17 1546)  methylPREDNISolone sodium succinate (SOLU-MEDROL) 125 mg/2 mL injection 125 mg (125 mg Intravenous Given 09/07/17 1254)  diphenhydrAMINE (BENADRYL) injection 25 mg (25 mg Intravenous Given 09/07/17 1254)  famotidine (PEPCID) IVPB 20 mg premix (0 mg Intravenous Stopped 09/07/17 1404)  EPINEPHrine (EPI-PEN) injection 0.3 mg (0.3 mg Intramuscular Given 09/07/17 1254)  sodium chloride 0.9 % bolus 1,000 mL (0 mLs Intravenous Stopped 09/07/17 1404)  fentaNYL (SUBLIMAZE) injection 100 mcg (100 mcg Intravenous Given 09/07/17 1400)  fentaNYL (SUBLIMAZE) injection 50 mcg (50 mcg Intravenous Given 09/07/17 1552)     Initial Impression / Assessment and Plan / ED Course  I have reviewed the triage vital signs and the nursing notes.  Pertinent labs & imaging results that were available during my care of the patient were reviewed by me and considered in my medical decision making (see chart for details).     52 y.o. M past medical history of angioedema who presents for evaluation of tongue and neck swelling that began approximate 1 hour prior to ED arrival.  Patient reports he has a history of angioedema.  Only known allergies are to lisinopril and what he thinks lidocaine.  Had lidocaine approximately 1 week ago and had a reaction was sent to the ED for further evaluation.  At that time he had Benadryl and steroids and was discharged home with any difficulty.  Reports no known  exposures to allergens today.  Denies any new foods or new exposures.  Patient states he was sitting at home when symptoms began.  He feels like the left side of his tongue is swollen.  Reports some irritation to the back of his throat into his neck. Patient is afebrile, non-toxic appearing, sitting comfortably on examination table. Vital signs reviewed and stable.  On exam, patient does have swelling to the left side of his tongue.  Phonation sounds slightly abnormal.  Lungs clear to auscultation.  No evidence of lip swelling.  Patient does have a history of angioedema that required intubation.  At that time, they cannot pass the tube through his throat and he required a trach.  No evidence of respiratory  distress at this time.  Given history, will give him EpiPen.  Additionally, will give steroids, Benadryl, Pepcid.  Reevaluation.  Patient reports some tremors for the EpiPen.  Phonation sounds slightly worse.  He states that his swelling feels the same.    CBC shows no significant leukocytosis.  Hemoglobin is 12.9.  BMP unremarkable.  1:25 PM: Paged critical care   1:37 PM: Discussed with Dr. Estill Batten (critical care).  Will admit. Will contact on-call for [employer].  RN informing the patient stated that he was feeling worse.  I evaluated patient.  He states that he was feeling like he was having more difficulty breathing.  Has some thickness and swelling around the neck that was getting worse.  Tongue swelling appeared to be getting worse.  Given concerned about difficult intubation from previous episode, the decision was made with patient and wife to go ahead and preemptively intubate before his symptoms worsened.   Discussed with critical care.  Aware that we are getting ready to intubate.  Will come to the ED to evaluate patient.  Discussed with Dr. Royce Macadamia (anesthesiology).  Will come to the ED to assist with intubation given difficult airway.  Dr. Royce Macadamia intubated.  Please see separate procedure  note.  Patient admitted to critical care.  Final Clinical Impressions(s) / ED Diagnoses   Final diagnoses:  Angioedema, initial encounter    ED Discharge Orders    None       Volanda Napoleon, PA-C 09/07/17 1608    Macarthur Critchley, MD 09/09/17 2109

## 2017-09-07 NOTE — ED Notes (Signed)
ED TO INPATIENT HANDOFF REPORT  Name/Age/Gender Robert Reyes 52 y.o. male  Code Status    Code Status Orders  (From admission, onward)        Start     Ordered   09/07/17 1507  Full code  Continuous     09/07/17 1509    Code Status History    Date Active Date Inactive Code Status Order ID Comments User Context   08/24/2016 2310 08/26/2016 1409 Full Code [id number]  Norval Morton, MD ED   07/02/2016 1007 07/05/2016 2035 Full Code [id number]  Raylene Miyamoto, MD ED      Home/SNF/Other Home  Chief Complaint Swollen neck   Level of Care/Admitting Diagnosis ED Disposition    ED Disposition Condition [facility] Area: [facility] [[id number]]  Level of Care: ICU [6]  Diagnosis: Angioedema [286381]  Admitting Physician: Kipp Brood [[id number]]  Attending Physician: Kipp Brood [[id number]]  Estimated length of stay: past midnight tomorrow  Certification:: I certify this patient will need inpatient services for at least 2 midnights  PT Class (Do Not Modify): Inpatient [101]  PT Acc Code (Do Not Modify): Private [1]       Medical History Past Medical History:  Diagnosis Date  . Angioedema   . Hypertension     Allergies Allergies  Allergen Reactions  . Lidocaine Swelling  . Lisinopril Swelling    Angioedema with Lisinopril/ HCTZ  . Ace Inhibitors Swelling    Angioedema    IV Location/Drains/Wounds Patient Lines/Drains/Airways Status   Active Line/Drains/Airways    Name:   Placement date:   Placement time:   Site:   Days:   Peripheral IV 09/07/17 Left Antecubital   09/07/17    1254    Antecubital   less than 1   Peripheral IV 09/07/17 Right Forearm   09/07/17    1409    Forearm   less than 1   Airway 8 mm   09/07/17    1455     less than 1          Labs/Imaging Results for orders placed or performed during the hospital encounter of 09/07/17 (from the past 48 hour(s))  Basic metabolic panel     Status: Abnormal    Collection Time: 09/07/17 12:45 PM  Result Value Ref Range   Sodium 142 135 - 145 mmol/L   Potassium 4.0 3.5 - 5.1 mmol/L   Chloride 110 101 - 111 mmol/L   CO2 26 22 - 32 mmol/L   Glucose, Bld 107 (H) 65 - 99 mg/dL   BUN 13 6 - 20 mg/dL   Creatinine, Ser 1.00 0.61 - 1.24 mg/dL   Calcium 9.2 8.9 - 10.3 mg/dL   GFR calc non Af Amer >60 >60 mL/min   GFR calc Af Amer >60 >60 mL/min    Comment: (NOTE) The eGFR has been calculated using the CKD EPI equation. This calculation has not been validated in all clinical situations. eGFR's persistently <60 mL/min signify possible Chronic Kidney Disease.    Anion gap 6 5 - 15    Comment: Performed at [facility], [place] [address], [place], [place] [address]  CBC with Differential     Status: Abnormal   Collection Time: 09/07/17 12:45 PM  Result Value Ref Range   WBC 10.4 4.0 - 10.5 K/uL   RBC 4.16 (L) 4.22 - 5.81 MIL/uL   Hemoglobin 12.9 (L) 13.0 - 17.0 g/dL   HCT 38.4 (  L) 39.0 - 52.0 %   MCV 92.3 78.0 - 100.0 fL   MCH 31.0 26.0 - 34.0 pg   MCHC 33.6 30.0 - 36.0 g/dL   RDW 14.2 11.5 - 15.5 %   Platelets 239 150 - 400 K/uL   Neutrophils Relative % 54 %   Neutro Abs 5.6 1.7 - 7.7 K/uL   Lymphocytes Relative 38 %   Lymphs Abs 4.0 0.7 - 4.0 K/uL   Monocytes Relative 6 %   Monocytes Absolute 0.6 0.1 - 1.0 K/uL   Eosinophils Relative 2 %   Eosinophils Absolute 0.2 0.0 - 0.7 K/uL   Basophils Relative 0 %   Basophils Absolute 0.0 0.0 - 0.1 K/uL    Comment: Performed at [facility], [place] [address] [place], [place] [address]   Dg Chest Port 1 View  Result Date: 09/07/2017 CLINICAL DATA:  Shortness of breath EXAM: PORTABLE CHEST 1 VIEW COMPARISON:  08/24/2016 FINDINGS: Cardiac shadow is within normal limits. Endotracheal tube is noted in satisfactory position. The lungs are well aerated bilaterally. No focal infiltrate or sizable effusion is seen. No bony abnormality is noted. IMPRESSION: Endotracheal tube  in satisfactory position. No acute abnormality seen. Electronically Signed   By: Inez Catalina M.D.   On: 09/07/2017 14:36    Pending Labs Unresulted Labs (From admission, onward)   Start     Ordered   09/14/17 0500  Creatinine, serum  (enoxaparin (LOVENOX)    CrCl >/= 30 ml/min)  Weekly,   R    Comments:  while on enoxaparin therapy    09/07/17 1509   09/08/17 0500  CBC  Tomorrow morning,   R     09/07/17 1509   09/08/17 7793  Basic metabolic panel  Tomorrow morning,   R     09/07/17 1509   09/07/17 1700  Blood gas, arterial  Once,   R     09/07/17 1541   09/07/17 1511  Triglycerides  (propofol (DIPRIVAN))  Every 72 hours,   R    Comments:  While on propofol (DIPRIVAN)    09/07/17 1510   09/07/17 1505  HIV antibody (Routine Testing)  Once,   R     09/07/17 1509   09/07/17 1505  CBC  (enoxaparin (LOVENOX)    CrCl >/= 30 ml/min)  Once,   R    Comments:  Baseline for enoxaparin therapy IF NOT ALREADY DRAWN.  Notify MD if PLT < 100 K.    09/07/17 1509   09/07/17 1505  Creatinine, serum  (enoxaparin (LOVENOX)    CrCl >/= 30 ml/min)  Once,   R    Comments:  Baseline for enoxaparin therapy IF NOT ALREADY DRAWN.    09/07/17 1509      Vitals/Pain Today's Vitals   09/07/17 1600 09/07/17 1605 09/07/17 1610 09/07/17 1615  BP: (!) 169/109 (!) 159/110 (!) 162/108 (!) 162/108  Pulse: 88 86 82 85  Resp: (!) 26 18 19  (!) 21  SpO2: 100% 100% 99% 100%  Height:        Isolation Precautions No active isolations  Medications Medications  0.9 %  sodium chloride infusion (has no administration in time range)  enoxaparin (LOVENOX) injection 40 mg (has no administration in time range)  famotidine (PEPCID) IVPB 20 mg premix (has no administration in time range)  lactated ringers infusion (has no administration in time range)  acetaminophen (TYLENOL) tablet 650 mg (has no administration in time range)  ondansetron (ZOFRAN) injection 4 mg (  has no administration in time range)  albuterol  (PROVENTIL) (2.5 MG/3ML) 0.083% nebulizer solution 2.5 mg (has no administration in time range)  dexamethasone (DECADRON) injection 10 mg (has no administration in time range)  diphenhydrAMINE (BENADRYL) injection 50 mg (has no administration in time range)  fentaNYL (SUBLIMAZE) injection 100 mcg (has no administration in time range)  fentaNYL (SUBLIMAZE) injection 100 mcg (has no administration in time range)  propofol (DIPRIVAN) 1000 MG/100ML infusion (30 mcg/kg/min  93 kg Intravenous Rate/Dose Change 09/07/17 1546)  methylPREDNISolone sodium succinate (SOLU-MEDROL) 125 mg/2 mL injection 125 mg (125 mg Intravenous Given 09/07/17 1254)  diphenhydrAMINE (BENADRYL) injection 25 mg (25 mg Intravenous Given 09/07/17 1254)  famotidine (PEPCID) IVPB 20 mg premix (0 mg Intravenous Stopped 09/07/17 1404)  EPINEPHrine (EPI-PEN) injection 0.3 mg (0.3 mg Intramuscular Given 09/07/17 1254)  sodium chloride 0.9 % bolus 1,000 mL (0 mLs Intravenous Stopped 09/07/17 1404)  fentaNYL (SUBLIMAZE) injection 100 mcg (100 mcg Intravenous Given 09/07/17 1400)  C1 esterase inhibitor (Human) (BERINERT) injection 2,000 Units (2,000 Units Intravenous Given 09/07/17 1611)  fentaNYL (SUBLIMAZE) injection 50 mcg (50 mcg Intravenous Given 09/07/17 1552)    Mobility walks

## 2017-09-07 NOTE — ED Notes (Signed)
[facility] contacted regarding patient transport.  

## 2017-09-07 NOTE — Progress Notes (Signed)
[facility] Progress Note Patient Name: Jahmar Mckelvy DOB: 11/13/1965 MRN: [id number]   Date of Service  09/07/2017  HPI/Events of Note  Agitation with concern for self-extubation  eICU Interventions  Soft restraints order placed        Saidy Ormand U Haydon Kalmar 09/07/2017, 8:48 PM

## 2017-09-07 NOTE — Anesthesia Procedure Notes (Signed)
Procedure Name: Intubation Date/Time: 09/07/2017 1:56 PM Performed by: Josephine Igo, MD Pre-anesthesia Checklist: Patient identified, Emergency Drugs available, Suction available, Patient being monitored and Timeout performed Patient Re-evaluated:Patient Re-evaluated prior to induction Oxygen Delivery Method: Ambu bag Preoxygenation: Pre-oxygenation with 100% oxygen Induction Type: IV induction Ventilation: Mask ventilation without difficulty Laryngoscope Size: Glidescope and 4 Grade View: Grade I Tube type: Subglottic suction tube Tube size: 8.0 mm Number of attempts: 1 Airway Equipment and Method: Stylet and Video-laryngoscopy Placement Confirmation: ETT inserted through vocal cords under direct vision,  breath sounds checked- equal and bilateral and CO2 detector Secured at: 23 cm Tube secured with: Tape Dental Injury: Teeth and Oropharynx as per pre-operative assessment  Difficulty Due To: Difficult Airway-  due to edematous airway and Difficult Airway- due to large tongue

## 2017-09-07 NOTE — ED Triage Notes (Signed)
Hx of angioedema. Patient presents with tongue swelling/ difficulty breathing. Patient able to speak in full sentences. Wife with patient. Patient does not known what could have caused this reaction.

## 2017-09-08 LAB — CBC
HCT: 41 % (ref 39.0–52.0)
Hemoglobin: 13.5 g/dL (ref 13.0–17.0)
MCH: 30.8 pg (ref 26.0–34.0)
MCHC: 32.9 g/dL (ref 30.0–36.0)
MCV: 93.4 fL (ref 78.0–100.0)
Platelets: 249 10*3/uL (ref 150–400)
RBC: 4.39 MIL/uL (ref 4.22–5.81)
RDW: 14.6 % (ref 11.5–15.5)
WBC: 12.9 10*3/uL — ABNORMAL HIGH (ref 4.0–10.5)

## 2017-09-08 LAB — GLUCOSE, CAPILLARY
GLUCOSE-CAPILLARY: 123 mg/dL — AB (ref 65–99)
Glucose-Capillary: 128 mg/dL — ABNORMAL HIGH (ref 65–99)
Glucose-Capillary: 133 mg/dL — ABNORMAL HIGH (ref 65–99)
Glucose-Capillary: 123 mg/dL — ABNORMAL HIGH (ref 65–99)

## 2017-09-08 LAB — HIV ANTIBODY (ROUTINE TESTING W REFLEX): HIV Screen 4th Generation wRfx: NONREACTIVE

## 2017-09-08 LAB — BASIC METABOLIC PANEL
ANION GAP: 9 (ref 5–15)
BUN: 24 mg/dL — ABNORMAL HIGH (ref 6–20)
CALCIUM: 8.9 mg/dL (ref 8.9–10.3)
CO2: 24 mmol/L (ref 22–32)
Chloride: 108 mmol/L (ref 101–111)
Creatinine, Ser: 1.6 mg/dL — ABNORMAL HIGH (ref 0.61–1.24)
GFR calc Af Amer: 56 mL/min — ABNORMAL LOW (ref >60)
GFR, EST NON AFRICAN AMERICAN: 49 mL/min — AB (ref >60)
Glucose, Bld: 154 mg/dL — ABNORMAL HIGH (ref 65–99)
Potassium: 6 mmol/L — ABNORMAL HIGH (ref 3.5–5.1)
Sodium: 141 mmol/L (ref 135–145)

## 2017-09-08 LAB — POTASSIUM: Potassium: 5.5 mmol/L — ABNORMAL HIGH (ref 3.5–5.1)

## 2017-09-08 MED ORDER — FENTANYL CITRATE (PF) 100 MCG/2ML IJ SOLN
50.0000 ug | Freq: Once | INTRAMUSCULAR | Status: AC
  Administered 2017-09-08: 50 ug via INTRAVENOUS

## 2017-09-08 MED ORDER — DEXAMETHASONE SODIUM PHOSPHATE 4 MG/ML IJ SOLN
4.0000 mg | Freq: Four times a day (QID) | INTRAMUSCULAR | Status: DC
  Administered 2017-09-08 – 2017-09-09 (×4): 4 mg via INTRAVENOUS
  Filled 2017-09-08 (×4): qty 1

## 2017-09-08 MED ORDER — MIDAZOLAM HCL 2 MG/2ML IJ SOLN
2.0000 mg | Freq: Once | INTRAMUSCULAR | Status: AC
  Administered 2017-09-08: 2 mg via INTRAVENOUS

## 2017-09-08 MED ORDER — SODIUM POLYSTYRENE SULFONATE 15 GM/60ML PO SUSP
30.0000 g | Freq: Once | ORAL | Status: AC
Start: 2017-09-08 — End: 2017-09-08
  Administered 2017-09-08: 30 g via RECTAL
  Filled 2017-09-08: qty 120

## 2017-09-08 MED ORDER — FENTANYL CITRATE (PF) 100 MCG/2ML IJ SOLN
INTRAMUSCULAR | Status: AC
  Administered 2017-09-08: 50 ug via INTRAVENOUS
  Filled 2017-09-08: qty 2

## 2017-09-08 MED ORDER — MIDAZOLAM HCL 2 MG/2ML IJ SOLN
INTRAMUSCULAR | Status: AC
  Administered 2017-09-08: 2 mg via INTRAVENOUS
  Filled 2017-09-08: qty 2

## 2017-09-08 MED ORDER — CHLORHEXIDINE GLUCONATE 0.12% ORAL RINSE (MEDLINE KIT)
15.0000 mL | Freq: Two times a day (BID) | OROMUCOSAL | Status: DC
  Administered 2017-09-08 – 2017-09-09 (×5): 15 mL via OROMUCOSAL

## 2017-09-08 MED ORDER — ORAL CARE MOUTH RINSE
15.0000 mL | OROMUCOSAL | Status: DC
  Administered 2017-09-08 – 2017-09-09 (×15): 15 mL via OROMUCOSAL

## 2017-09-08 MED ORDER — INSULIN ASPART 100 UNIT/ML ~~LOC~~ SOLN
0.0000 [IU] | SUBCUTANEOUS | Status: DC
  Administered 2017-09-08 – 2017-09-09 (×7): 2 [IU] via SUBCUTANEOUS

## 2017-09-08 NOTE — Progress Notes (Signed)
[facility] Progress Note Patient Name: Dairon Procter DOB: 10-Sep-1965 MRN: [id number]   Date of Service  09/08/2017  HPI/Events of Note  Agitation with risk of self-extubation  eICU Interventions  Soft wrist restraint order        Frederik Pear 09/08/2017, 7:41 PM

## 2017-09-08 NOTE — Progress Notes (Signed)
PULMONARY / CRITICAL CARE MEDICINE   Name: Robert Reyes MRN: [id number] DOB: 1965-06-22    ADMISSION DATE:  09/07/2017  REFERRING MD:  EDP   CHIEF COMPLAINT:  Angioedema   HISTORY OF PRESENT ILLNESS:   52yo male with hx HTN and previous angioedema no longer on ACE or ARB who presented 6/5 with tongue swelling and dyspnea.  Of note he was just seen 5/24 with lip swelling which began after having a laceration to his eyebrow repair.  It was assumed that he was having a reaction to the lidocaine.  He was given steroids, benadryl, pepcid and improved quickly and was sent home with resolution of his symptoms at that time.  On 6/5 he returned with tongue, throat and neck swelling.  No new medications, foods or other known exposures.  In ER he had worsening dyspnea and swelling and decision was made to intubate.  Found to have marked swelling of tongue and upper airway.  Patient indicates this is pattern similar to prior.  PCCM called for admission.    SUBJECTIVE:  Lightly sedated on propofol & fentanyl Awakens and follows commands, nods to questions On PSV trial  VITAL SIGNS: BP 101/79 (BP Location: Right Arm)   Pulse 64   Temp (!) 97.5 F (36.4 C) (Oral)   Resp (!) 23   Ht 6' (1.829 m)   Wt 205 lb 7.5 oz (93.2 kg)   SpO2 92%   BMI 27.87 kg/m   HEMODYNAMICS:  on no vasoactive infusion.  VENTILATOR SETTINGS: Vent Mode: PSV FiO2 (%):  [30 %-100 %] 30 % Set Rate:  [15 bmp] 15 bmp Vt Set:  [540 mL-570 mL] 570 mL PEEP:  [5 cmH20] 5 cmH20 Pressure Support:  [5 cmH20] 5 cmH20 Plateau Pressure:  [13 cmH20-18 cmH20] 16 cmH20 Vent Mode: PSV FiO2 (%):  [30 %-100 %] 30 % Set Rate:  [15 bmp] 15 bmp Vt Set:  [540 mL-570 mL] 570 mL PEEP:  [5 cmH20] 5 cmH20 Pressure Support:  [5 cmH20] 5 cmH20 Plateau Pressure:  [13 cmH20-18 cmH20] 16 cmH20  INTAKE / OUTPUT: I/O last 3 completed shifts: In: 1118.2 [I.V.:918.2; IV Piggyback:200] Out: 5956 [Urine:1275]  PHYSICAL  EXAMINATION: General:  Actutely ill-appearing male, well nourished, laying in bed, intubated, in no acute distress Neuro:  Lightly sedated, arouses to voice, follows commands, No focal deficits, Nods to questions  HEENT: Atraumatic, normocephalic, ETT in place, tongue remains swollen  Cardiovascular:  RRR, s1s2 noted, No M/R/G, No edema  Lungs:  Clear upon auscultation, no wheezing, even, non-labored,no assessory muscle use, PSV on vent  Abdomen:  Soft, non-tender, BS+ x4 Musculoskeletal: No deformities, normal bulk and tone Skin:  Warm, dry.  No obvious rashes, lesions, or ulcerations  LABS:  BMET Recent Labs  Lab 09/07/17 1245 09/08/17 0741  NA 142 141  K 4.0 6.0*  CL 110 108  CO2 26 24  BUN 13 24*  CREATININE 1.00 1.60*  GLUCOSE 107* 154*    Electrolytes Recent Labs  Lab 09/07/17 1245 09/08/17 0741  CALCIUM 9.2 8.9    CBC Recent Labs  Lab 09/07/17 1245 09/08/17 0741  WBC 10.4 12.9*  HGB 12.9* 13.5  HCT 38.4* 41.0  PLT 239 249    Coag's No results for input(s): APTT, INR in the last 168 hours.  Sepsis Markers No results for input(s): LATICACIDVEN, PROCALCITON, O2SATVEN in the last 168 hours.  ABG Recent Labs  Lab 09/07/17 1732  PHART 7.362  PCO2ART 38.1  PO2ART 424*  Liver Enzymes No results for input(s): AST, ALT, ALKPHOS, BILITOT, ALBUMIN in the last 168 hours.  Cardiac Enzymes No results for input(s): TROPONINI, PROBNP in the last 168 hours.  Glucose No results for input(s): GLUCAP in the last 168 hours.  Imaging Dg Chest Port 1 View  Result Date: 09/07/2017 CLINICAL DATA:  Shortness of breath EXAM: PORTABLE CHEST 1 VIEW COMPARISON:  08/24/2016 FINDINGS: Cardiac shadow is within normal limits. Endotracheal tube is noted in satisfactory position. The lungs are well aerated bilaterally. No focal infiltrate or sizable effusion is seen. No bony abnormality is noted. IMPRESSION: Endotracheal tube in satisfactory position. No acute abnormality  seen. Electronically Signed   By: Inez Catalina M.D.   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   No clear trigger despite prior episodes. Possible familial component.   ASSESSMENT / PLAN:  Acute respiratory failure - r/t airway compromise in the setting of angioedema  Tobacco abuse  PLAN -  Vent support -8 cc/kg Daily SBT When extubation paremeters met, check cuff leak prior to extubation Dr. Lynetta Mare to perform bedside Bronch/laryngoscopy 6/6 to assess edema prior to extuabation Smoking cessation F/u CXR in am 6/7 F/u ABG prn   Angioedema -- has hx of angioedema as well. Some ?as to whether this is familial inherited angioedema.  Not on ACE or ARB.  Recent angioedema 5/24 after lidocaine given for sutures. He is a smoker as well.  ?reaction to cigarette additive.  PLAN -  Continue Dexamethasone Continue Pepcid Continue Benadryl Dexamethasone Maintain airway as above    Hx HTN  PLAN - Prn Labetalol for SBP>160  Hold outpatient Norvasc & clonidine for now    Hyperkalemia -K 6.0 PLAN-  Recheck serum K -Recheck K 5.5 -Telemetry reveals NSR, no peaked T waves or arrhthymias -Will give kayexalate  Hyperglycemia, likely in setting of Steroids (no hx of DM) PLAN - CBG's q4h SSI   VTE prophylaxis - Lovenox SUP prophylasix - Pepcid Nutrition - NPO for now, given swelling will likely be unable to pass NG/OG tube without causing more swelling/trauma.  Pt has enough nutritional reserve for now, will reassess again 6/7.    FAMILY  - Updates: No family present at bedside 09/08/17.   - Inter-disciplinary family meet or Palliative Care meeting due by: 09/14/17   6/6>>Bedside Bronch/laryngoscopy reveals that larynx remains swollen.  Pt to remain intubated 6/6, will reassess oral airway edema in am 6/7  with bedside Bronch/laryngoscopy for improvement and extubation  possibility.  Darel Hong, AGACNP-BC [facility] Pager [phone] After Hours  [phone]

## 2017-09-08 NOTE — Progress Notes (Signed)
Initial Nutrition Assessment  DOCUMENTATION CODES:   Not applicable  INTERVENTION:  - If pt to remain intubated another 24 hours or greater, recommend OGT placement and Vital AF 1.2 @ 55 mL/hr with 30 mL Prostat x1/day.  - This regimen + kcal from current Propofol rate will provide 1980 kcal (98% estimated kcal need), 114 grams of protein, and 1070 mL free water.  - Will monitor Propofol rate and changes.   NUTRITION DIAGNOSIS:   Inadequate oral intake related to inability to eat as evidenced by NPO status.  GOAL:   Patient will meet greater than or equal to 90% of their needs  MONITOR:   Vent status, Weight trends, Labs  REASON FOR ASSESSMENT:   Ventilator  ASSESSMENT:   52yo male with hx HTN and previous angioedema no longer on ACE or ARB who presented 6/5 with tongue swelling and dyspnea.  Of note he was just seen 5/24 with lip swelling which began after having a laceration to his eyebrow repair.  It was assumed that he was having a reaction to the lidocaine.  He was given steroids, benadryl, pepcid and improved quickly and was sent home with resolution of his symptoms at that time.  On 6/5 he returned with tongue, throat and neck swelling.  No new medications, foods or other known exposures.  In ER he had worsening dyspnea and swelling and decision was made to intubate.  Found to have marked swelling of tongue and upper airway.  Patient indicates this is pattern similar to prior.  BMI indicates overweight status. Pt is intubated, sedated, with no OGT or NGT in place. Pt had bronch this AM. Spoke with RN who reports that Propofol not adjusted in relation to procedure and will likely remain at current rate throughout this shift. No family/visitors present at this time.    Patient is currently intubated on ventilator support MV: 8.5 L/min Temp (24hrs), Avg:98.2 F (36.8 C), Min:97.5 F (36.4 C), Max:99 F (37.2 C) Propofol: 11.2 ml/hr (296 kcal) BP: 120/93 and MAP:  99  Medications reviewed; 20 mg IV Pepcid BID, sliding scale Novolog, 125 mg Solu-medrol x1 yesterday, 30 g Kayexalate x1 today.  Labs reviewed; K: 6 mmol/L, BUN: 24 mg/dL, creatinine: 1.6 mg/dL, GFR: 56 mL/min.  IVF: LR @ 100 mL/hr.  Drips: Propofol @ 20 mcg/kg/min, Fentanyl @ 40 mcg/hr.      NUTRITION - FOCUSED PHYSICAL EXAM:  Completed; no muscle and no fat wasting.   Diet Order:   Diet Order           Diet NPO time specified  Diet effective now          EDUCATION NEEDS:   No education needs have been identified at this time  Skin:  Skin Assessment: Reviewed RN Assessment  Last BM:  PTA/unknown  Height:   Ht Readings from Last 1 Encounters:  09/07/17 6' (1.829 m)    Weight:   Wt Readings from Last 1 Encounters:  09/08/17 205 lb 7.5 oz (93.2 kg)    Ideal Body Weight:  80.91 kg  BMI:  Body mass index is 27.87 kg/m.  Estimated Nutritional Needs:   Kcal:  2024  Protein:  112-140 grams (1.2-1.5 grams/kg)  Fluid:  >/= 2 L/day      Jarome Matin, MS, RD, LDN, [facility] Inpatient Clinical Dietitian Pager # [phone] After hours/weekend pager # [phone]

## 2017-09-08 NOTE — Progress Notes (Signed)
Assisted with bedside bronchoscopy to check for airway swelling. Pt placed on full support settings and 100% o2 settings. MD determined airway still had swelling.

## 2017-09-09 ENCOUNTER — Inpatient Hospital Stay (HOSPITAL_COMMUNITY): Payer: Commercial Managed Care - PPO

## 2017-09-09 DIAGNOSIS — J9600 Acute respiratory failure, unspecified whether with hypoxia or hypercapnia: Secondary | ICD-10-CM

## 2017-09-09 LAB — GLUCOSE, CAPILLARY
GLUCOSE-CAPILLARY: 121 mg/dL — AB (ref 65–99)
GLUCOSE-CAPILLARY: 133 mg/dL — AB (ref 65–99)
Glucose-Capillary: 116 mg/dL — ABNORMAL HIGH (ref 65–99)
Glucose-Capillary: 130 mg/dL — ABNORMAL HIGH (ref 65–99)

## 2017-09-09 LAB — BASIC METABOLIC PANEL
Anion gap: 6 (ref 5–15)
BUN: 23 mg/dL — AB (ref 6–20)
CALCIUM: 8.7 mg/dL — AB (ref 8.9–10.3)
CO2: 24 mmol/L (ref 22–32)
CREATININE: 1.1 mg/dL (ref 0.61–1.24)
Chloride: 111 mmol/L (ref 101–111)
GFR calc Af Amer: >60 mL/min (ref >60)
GLUCOSE: 126 mg/dL — AB (ref 65–99)
POTASSIUM: 4.5 mmol/L (ref 3.5–5.1)
SODIUM: 141 mmol/L (ref 135–145)

## 2017-09-09 LAB — CBC
HEMATOCRIT: 35.2 % — AB (ref 39.0–52.0)
Hemoglobin: 11.6 g/dL — ABNORMAL LOW (ref 13.0–17.0)
MCH: 30.5 pg (ref 26.0–34.0)
MCHC: 33 g/dL (ref 30.0–36.0)
MCV: 92.6 fL (ref 78.0–100.0)
PLATELETS: 239 10*3/uL (ref 150–400)
RBC: 3.8 MIL/uL — ABNORMAL LOW (ref 4.22–5.81)
RDW: 14.8 % (ref 11.5–15.5)
WBC: 19.4 10*3/uL — AB (ref 4.0–10.5)

## 2017-09-09 MED ORDER — CLONIDINE HCL 0.2 MG PO TABS
0.2000 mg | ORAL_TABLET | Freq: Two times a day (BID) | ORAL | Status: DC
  Administered 2017-09-09 – 2017-09-10 (×3): 0.2 mg via ORAL
  Filled 2017-09-09: qty 1
  Filled 2017-09-09: qty 2
  Filled 2017-09-09: qty 1

## 2017-09-09 MED ORDER — AMLODIPINE BESYLATE 10 MG PO TABS
10.0000 mg | ORAL_TABLET | Freq: Every day | ORAL | Status: DC
  Administered 2017-09-09 – 2017-09-10 (×2): 10 mg via ORAL
  Filled 2017-09-09 (×2): qty 1

## 2017-09-09 MED ORDER — DEXAMETHASONE SODIUM PHOSPHATE 4 MG/ML IJ SOLN
4.0000 mg | Freq: Two times a day (BID) | INTRAMUSCULAR | Status: DC
  Administered 2017-09-09 – 2017-09-10 (×2): 4 mg via INTRAVENOUS
  Filled 2017-09-09 (×2): qty 1

## 2017-09-09 NOTE — Plan of Care (Signed)
Pt has no complaints of pain

## 2017-09-09 NOTE — Procedures (Signed)
Extubation Procedure Note  Patient Details:   Name: Robert Reyes DOB: December 31, 1965 MRN: [id number]   Airway Documentation:    Vent end date: 09/09/17 Vent end time: 0925   Evaluation  O2 sats: stable throughout Complications: No apparent complications Patient did tolerate procedure well. Bilateral Breath Sounds: Clear   Yes  Gonzella Lex 09/09/2017, 9:29 AM

## 2017-09-09 NOTE — Progress Notes (Signed)
PULMONARY / CRITICAL CARE MEDICINE   Name: Robert Reyes MRN: [id number] DOB: [birth year]    ADMISSION DATE:  09/07/2017  REFERRING MD:  EDP   CHIEF COMPLAINT:  Angioedema   HISTORY OF PRESENT ILLNESS:   52yo male with hx HTN and previous angioedema no longer on ACE or ARB who presented 6/5 with tongue swelling and dyspnea.  Of note he was just seen 5/24 with lip swelling which began after having a laceration to his eyebrow repair.  It was assumed that he was having a reaction to the lidocaine.  He was given steroids, benadryl, pepcid and improved quickly and was sent home with resolution of his symptoms at that time.  On 6/5 he returned with tongue, throat and neck swelling.  No new medications, foods or other known exposures.  In ER he had worsening dyspnea and swelling and decision was made to intubate.  Found to have marked swelling of tongue and upper airway.  Patient indicates this is pattern similar to prior.  PCCM called for admission.    SUBJECTIVE:  Sedation off, awake, follows commands On PSV, tolerating Noticeably less tongue swelling today Positive cuff leak  VITAL SIGNS: BP (!) 147/92 (BP Location: Right Arm)   Pulse 82   Temp 97.7 F (36.5 C) (Oral)   Resp 20   Ht 6' (1.829 m)   Wt 208 lb 12.4 oz (94.7 kg)   SpO2 93%   BMI 28.32 kg/m   HEMODYNAMICS:  on no vasoactive infusion.  VENTILATOR SETTINGS: Vent Mode: PSV;CPAP FiO2 (%):  [30 %-40 %] 30 % Set Rate:  [15 bmp] 15 bmp Vt Set:  [570 mL] 570 mL PEEP:  [5 cmH20] 5 cmH20 Pressure Support:  [5 cmH20] 5 cmH20 Plateau Pressure:  [16 cmH20-20 cmH20] 16 cmH20 Vent Mode: PSV;CPAP FiO2 (%):  [30 %-40 %] 30 % Set Rate:  [15 bmp] 15 bmp Vt Set:  [570 mL] 570 mL PEEP:  [5 cmH20] 5 cmH20 Pressure Support:  [5 cmH20] 5 cmH20 Plateau Pressure:  [16 cmH20-20 cmH20] 16 cmH20  INTAKE / OUTPUT: I/O last 3 completed shifts: In: 1884 [I.V.:3323; IV Piggyback:150] Out: 1660 [Urine:1650; Stool:1]  PHYSICAL  EXAMINATION: General:  Acutely ill-appearing male,well nourished, laying in bed, intubated, in no acute distress Neuro:  Awake, alert, follows commands, Pupils PERRL, no focal deficits HEENT: Atraumatic, normocephalic, ETT in place, tongue swelling improved  Cardiovascular: RRR, s1s2 noted, No M/R/G, No edema  Lungs:  Clear upon auscultation, no wheezing, even, non-labored, no assessory muscle use, PSV  Abdomen:  Soft, non-tender, non-distended, BS+ x4 Musculoskeletal: No deformities, normal bulk and tone Skin:  Warm, dry.  No obvious rashes, lesions, or ulcerations.  LABS:  BMET Recent Labs  Lab 09/07/17 1245 09/08/17 0741 09/08/17 0944 09/09/17 0331  NA 142 141  --  141  K 4.0 6.0* 5.5* 4.5  CL 110 108  --  111  CO2 26 24  --  24  BUN 13 24*  --  23*  CREATININE 1.00 1.60*  --  1.10  GLUCOSE 107* 154*  --  126*    Electrolytes Recent Labs  Lab 09/07/17 1245 09/08/17 0741 09/09/17 0331  CALCIUM 9.2 8.9 8.7*    CBC Recent Labs  Lab 09/07/17 1245 09/08/17 0741 09/09/17 0331  WBC 10.4 12.9* 19.4*  HGB 12.9* 13.5 11.6*  HCT 38.4* 41.0 35.2*  PLT 239 249 239    Coag's No results for input(s): APTT, INR in the last 168 hours.  Sepsis Markers No  results for input(s): LATICACIDVEN, PROCALCITON, O2SATVEN in the last 168 hours.  ABG Recent Labs  Lab 09/07/17 1732  PHART 7.362  PCO2ART 38.1  PO2ART 424*    Liver Enzymes No results for input(s): AST, ALT, ALKPHOS, BILITOT, ALBUMIN in the last 168 hours.  Cardiac Enzymes No results for input(s): TROPONINI, PROBNP in the last 168 hours.  Glucose Recent Labs  Lab 09/08/17 1204 09/08/17 1612 09/08/17 1910 09/08/17 2327 09/09/17 0314 09/09/17 0724  GLUCAP 128* 123* 133* 123* 116* 130*    Imaging Dg Chest Port 1 View  Result Date: 09/09/2017 CLINICAL DATA:  Acute respiratory failure. EXAM: PORTABLE CHEST 1 VIEW COMPARISON:  09/07/2017 FINDINGS: 0430 hours. Endotracheal tube tip is 7.2 cm above the  base of the carina. Lung volumes are low. There is interval increase in bibasilar atelectasis. Tiny pleural effusions suspected. Component of infection in the right lung base cannot be excluded. Cardiopericardial silhouette is at upper limits of normal for size. The visualized bony structures of the thorax are intact. Telemetry leads overlie the chest. IMPRESSION: Low lung volumes with bibasilar atelectasis. Probable tiny pleural effusions. Electronically Signed   By: Misty Stanley M.D.   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   No clear trigger despite prior episodes. Possible familial component.   ASSESSMENT / PLAN:  Acute respiratory failure - r/t airway compromise in the setting of angioedema  Tobacco abuse  PLAN -  Vent support 8 cc/kg SBT daily VAP bundle  Pulmonary hygiene Check cuff leak 6/7, if positive will extubate>>EXTUBATE 6/7 Smoking cessation counseling   Angioedema -- has hx of angioedema as well. Some ?as to whether this is familial inherited angioedema.  Not on ACE or ARB.  Recent angioedema 5/24 after lidocaine given for sutures. He is a smoker as well.  ?reaction to cigarette additive.  PLAN -  Continue Dexamethasone Continue Pepcid Continue Benadryl   Hx HTN  PLAN - Prn Labetalol for SBP>160 Once extubated and tolerating PO, will restart home Norvasc and Clonidine  Leukocytosis, likely in setting of Dexamethasone and stress response -Afebrile PLAN - Monitor fever curve Follow WBC's  Hyperglycemia, likely in setting of Steroids (no hx of DM) PLAN - CBG'S SSI   VTE prophylaxis - Lovenox SUP Prophylaxis - Pepcid Nutrition - once extubated, obtain speech evaluation and advance diet as tolerated  Pt tolerating PSV, swelling of tongue has significantly improved. Dr. Elsworth Soho at bedside, cuff leak present.  Will extubate 6/7 per Dr.  Bari Mantis order.   FAMILY  - Updates: No family present at bedside 09/09/17.  - Inter-disciplinary family meet or Palliative Care meeting due by: 09/14/17    Darel Hong, AGACNP-BC [facility] Pager [phone] After Hours  [phone]

## 2017-09-09 NOTE — Progress Notes (Signed)
65 mg of IV fentanyl wasted in sink. Witnessed by Prudy Feeler, Therapist, sports.

## 2017-09-09 NOTE — Progress Notes (Addendum)
52 year old admitted 6/5 with recurrent angioedema requiring mechanical ventilation. I noted similar episodes 06/2016, 08/2016 had an extensive evaluation by allergist Dr. Verlin Fester following this including negative C1 esterase inhibitor. He developed lip swelling on 5/24 after having a laceration repaired and was treated for presumptive allergy to lidocaine with rapid improvement.  He returned on 6/5 with swelling of his tongue throat and neck and was intubated.  On exam-awake, following commands when propofol drip turned off, nonfocal, S1-S2 normal, has good cuff leak and audible, was able to cough on command with minimal secretions.  Labs show decrease leukocytosis and normal electrolytes. Chest x-ray shows no new infiltrates, personally reviewed, ET tube appears to be high 7 cm above carina.  Impression/plan 1.  Acute respiratory failure-cuff leak was tested and after he weaned successfully, he was extubated to room air and seems to be tolerating well without audible stridor, his voice is okay.  Recurrent angioedema-with negative allergy evaluation in the past including negative C1 esterase inhibitor.  He also seems to have a family history of similar complaints and a brother and perhaps his father.  He reports that epinephrine injection on one occasion made his symptoms worse but I am not sure whether this was a reaction, more likely this was disease progression. I have advised him to keep EpiPen handy and a steroid Dosepak and Benadryl handy.  He may need further allergy evaluation at [facility] or [facility] I expect that he can get discharged within the next 24 hours.   Change to stepdown status and to triad 6/8 My cc time x 68m Rakesh V. Elsworth Soho MD

## 2017-09-09 NOTE — Progress Notes (Signed)
Nutrition Follow-up  DOCUMENTATION CODES:   Not applicable  INTERVENTION:  - Diet advancement as medically feasible. - RD will provide interventions, if warranted, once diet advanced.   NUTRITION DIAGNOSIS:   Inadequate oral intake related to inability to eat as evidenced by NPO status. -ongoing  GOAL:   Patient will meet greater than or equal to 90% of their needs -unmet/unable to meet  MONITOR:   Diet advancement, Weight trends, Labs  ASSESSMENT:   52yo male with hx HTN and previous angioedema no longer on ACE or ARB who presented 6/5 with tongue swelling and dyspnea.  Of note he was just seen 5/24 with lip swelling which began after having a laceration to his eyebrow repair.  It was assumed that he was having a reaction to the lidocaine.  He was given steroids, benadryl, pepcid and improved quickly and was sent home with resolution of his symptoms at that time.  On 6/5 he returned with tongue, throat and neck swelling.  No new medications, foods or other known exposures.  In ER he had worsening dyspnea and swelling and decision was made to intubate.  Found to have marked swelling of tongue and upper airway.  Patient indicates this is pattern similar to prior.  Patient extubated about an hour and a half ago. Estimated nutrition needs updated based on this event. Weight consistent with admission weight. Patient remains NPO at this time.   Per Robert Reyes's note this AM: patient with hx of tobacco use, angioedema significantly improved (had a similar event on 5/24 after receiving lidocaine), leukocytosis, hyperglycemia.  Medications reviewed; 20 mg IV Pepcid BID, sliding scale Novolog.  Labs reviewed; CBGs: 116 and 130 mg/dL this AM, BUN: 23 mg/dL, Ca: 8.7 mg/dL.    Diet Order:   Diet Order    None      EDUCATION NEEDS:   No education needs have been identified at this time  Skin:  Skin Assessment: Reviewed RN Assessment  Last BM:  6/6  Height:   Ht Readings from Last  1 Encounters:  09/07/17 6' (1.829 m)    Weight:   Wt Readings from Last 1 Encounters:  09/09/17 208 lb 12.4 oz (94.7 kg)    Ideal Body Weight:  80.91 kg  BMI:  Body mass index is 28.32 kg/m.  Estimated Nutritional Needs:   Kcal:  8325-4982 (20-22 kcal/kg)  Protein:  115-130 grams (1.2-1.4 grams/kg)  Fluid:  >/= 2 L/day     Robert Matin, MS, RD, LDN, [facility] Inpatient Clinical Dietitian Pager # [phone] After hours/weekend pager # [phone]

## 2017-09-10 LAB — CBC
HEMATOCRIT: 37.4 % — AB (ref 39.0–52.0)
Hemoglobin: 12.3 g/dL — ABNORMAL LOW (ref 13.0–17.0)
MCH: 29.9 pg (ref 26.0–34.0)
MCHC: 32.9 g/dL (ref 30.0–36.0)
MCV: 91 fL (ref 78.0–100.0)
Platelets: 254 10*3/uL (ref 150–400)
RBC: 4.11 MIL/uL — ABNORMAL LOW (ref 4.22–5.81)
RDW: 14.3 % (ref 11.5–15.5)
WBC: 15 10*3/uL — ABNORMAL HIGH (ref 4.0–10.5)

## 2017-09-10 LAB — BASIC METABOLIC PANEL
Anion gap: 5 (ref 5–15)
BUN: 24 mg/dL — ABNORMAL HIGH (ref 6–20)
CALCIUM: 8.9 mg/dL (ref 8.9–10.3)
CO2: 29 mmol/L (ref 22–32)
CREATININE: 1.04 mg/dL (ref 0.61–1.24)
Chloride: 107 mmol/L (ref 101–111)
GFR calc Af Amer: >60 mL/min (ref >60)
GFR calc non Af Amer: >60 mL/min (ref >60)
GLUCOSE: 141 mg/dL — AB (ref 65–99)
Potassium: 4.6 mmol/L (ref 3.5–5.1)
Sodium: 141 mmol/L (ref 135–145)

## 2017-09-10 MED ORDER — EPINEPHRINE 0.3 MG/0.3ML IJ SOAJ: 0.3000 mg | INTRAMUSCULAR | Status: DC | PRN

## 2017-09-10 MED ORDER — PREDNISONE 10 MG PO TABS
ORAL_TABLET | ORAL | 0 refills | Status: DC
Start: 2017-09-10 — End: 2018-01-05

## 2017-09-10 MED ORDER — EPINEPHRINE 0.3 MG/0.3ML IJ SOAJ: 0.3000 mg | INTRAMUSCULAR | 2 refills | Status: DC | PRN

## 2017-09-10 NOTE — Discharge Summary (Signed)
Physician Discharge Summary  Patient ID: Robert Reyes MRN: [id number] DOB/AGE: Aug 20, 1965 52 y.o.  Admit date: 09/07/2017 Discharge date: 09/10/2017  Problem List Active Problems:   Angioedema  HPI: 52yo male with hx HTN and previous angioedema no longer on ACE or ARB who presented 6/5 with tongue swelling and dyspnea.  Of note he was just seen 5/24 with lip swelling which began after having a laceration to his eyebrow repair.  It was assumed that he was having a reaction to the lidocaine.  He was given steroids, benadryl, pepcid and improved quickly and was sent home with resolution of his symptoms at that time.  On 6/5 he returned with tongue, throat and neck swelling.  No new medications, foods or other known exposures.  In ER he had worsening dyspnea and swelling and decision was made to intubate.  Found to have marked swelling of tongue and upper airway.  Patient indicates this is pattern similar to prior.  PCCM called for admission.     Hospital Course:   Impression/plan 1.  Acute respiratory failure-cuff leak was tested and after he weaned successfully, he was extubated to room air and seems to be tolerating well without audible stridor, his voice is okay.  Recurrent angioedema-with negative allergy evaluation in the past including negative C1 esterase inhibitor.  He also seems to have a family history of similar complaints and a brother and perhaps his father.  He reports that epinephrine injection on one occasion made his symptoms worse but I am not sure whether this was a reaction, more likely this was disease progression. I have advised him to keep EpiPen handy and a steroid Dosepak and Benadryl handy.    After follow-up with his allergist Dr. Verlin Fester in [facility] and [facility] would suggest follow-up with [facility] for recurrent angioedema.     Labs at discharge Lab Results  Component Value Date   CREATININE  1.04 09/10/2017   BUN 24 (H) 09/10/2017   NA 141 09/10/2017   K 4.6 09/10/2017   CL 107 09/10/2017   CO2 29 09/10/2017   Lab Results  Component Value Date   WBC 15.0 (H) 09/10/2017   HGB 12.3 (L) 09/10/2017   HCT 37.4 (L) 09/10/2017   MCV 91.0 09/10/2017   PLT 254 09/10/2017   Lab Results  Component Value Date   ALT 27 08/24/2016   AST 19 08/24/2016   ALKPHOS 60 08/24/2016   BILITOT 0.8 08/24/2016   Lab Results  Component Value Date   INR 0.97 07/02/2016    Current radiology studies Dg Chest Port 1 View  Result Date: 09/09/2017 CLINICAL DATA:  Acute respiratory failure. EXAM: PORTABLE CHEST 1 VIEW COMPARISON:  09/07/2017 FINDINGS: 0430 hours. Endotracheal tube tip is 7.2 cm above the base of the carina. Lung volumes are low. There is interval increase in bibasilar atelectasis. Tiny pleural effusions suspected. Component of infection in the right lung base cannot be excluded. Cardiopericardial silhouette is at upper limits of normal for size. The visualized bony structures of the thorax are intact. Telemetry leads overlie the chest. IMPRESSION: Low lung volumes with bibasilar atelectasis. Probable tiny pleural effusions. Electronically Signed   By: Misty Stanley M.D.   On: 09/09/2017 07:18    Disposition:  Discharge disposition: 01-Home or Self Care        Allergies as of 09/10/2017      Reactions   Lidocaine Swelling   Lisinopril Swelling   Angioedema with  Lisinopril/ HCTZ   Ace Inhibitors Swelling   Angioedema      Medication List    STOP taking these medications   nicotine polacrilex 2 MG gum Commonly known as:  NICORETTE     TAKE these medications   amLODipine 10 MG tablet Commonly known as:  NORVASC Take 1 tablet (10 mg total) by mouth daily.   cloNIDine 0.2 MG tablet Commonly known as:  CATAPRES Take 1 tablet (0.2 mg total) by mouth 2 (two) times daily.   diphenhydrAMINE 25 mg capsule Commonly known as:  BENADRYL Take 25 mg by mouth every 6 (six)  hours as needed.   EPINEPHrine 0.3 mg/0.3 mL Soaj injection Commonly known as:  EPI-PEN Inject 0.3 mLs (0.3 mg total) into the muscle every 3 (three) days as needed (throat swelling).   ibuprofen 800 MG tablet Commonly known as:  ADVIL,MOTRIN Take 800 mg by mouth every 6 (six) hours as needed for pain.   predniSONE 10 MG tablet Commonly known as:  DELTASONE Prednisone 10 mg tabs Take 4 tabs  daily with food x 4 days, then 3 tabs daily x 4 days, then 2 tabs daily x 4 days, then 1 tab daily x4 days then stop. #40      Follow-up Information    [name], Sedalia Muta, MD. Schedule an appointment as soon as possible for a visit.   Specialty:  Allergy and Immunology Why:  call and make an appointment Contact information: [place] [address] [phone]            Discharged Condition: good  Time spent on discharge  40 minutes.  Vital signs at Discharge. Temp:  [98.1 F (36.7 C)-98.5 F (36.9 C)] 98.5 F (36.9 C) (06/08 0508) Pulse Rate:  [61-86] 68 (06/08 0508) Resp:  [14-17] 17 (06/08 0508) BP: (114-156)/(73-105) 142/93 (06/08 1012) SpO2:  [95 %-100 %] 97 % (06/08 0508) Weight:  [92.9 kg (204 lb 14.4 oz)] 92.9 kg (204 lb 14.4 oz) (06/08 0515) Office follow up Special Information or instructions.  He is to follow-up with Dr. Leanord Hawking who is his allergist.  Should also follow-up with [facility]. There is no need for him to follow-up with pulmonary critical care at this time  Signed: Richardson Landry Ranika Mcniel ACNP Maryanna [facility] PCCM Pager [phone] till 1 pm If no answer page [phone] 09/10/2017, 12:31 PM

## 2017-09-10 NOTE — Progress Notes (Signed)
52 year old admitted 6/5 with recurrent angioedema requiring mechanical ventilation   He has done well since extubation and can discharge today. He will be given prescription for epinephrine pen, steroid Dosepak and Benadryl to keep handy .  Recurrent angioedema-with negative allergy evaluation in the past including negative C1 esterase inhibitor.  He also seems to have a family history of similar complaints and a brother and perhaps his father.     Follow-up with his allergist Dr. Verlin Fester in [facility] and with [facility] for [facility].  He will likely need referral to [facility] from an allergy standpoint for recurrent angioedema  Rakesh V. Elsworth Soho MD

## 2017-09-10 NOTE — Discharge Instructions (Signed)
Follow up with your MD at [facility]. Keep Epi pen and Benadryl with in close reach as needed. Note: You had an critical reaction with angioedema that was life threatening and should not return to work till 09/12/2017. Richardson Landry Desarae Placide ACNP

## 2017-09-10 NOTE — Progress Notes (Signed)
Assessment unchanged. Pt verbalized understanding of dc instructions through teach back including follow up care and when to call the doctor. Medications reviewed with verbalized understanding. Script x 2 given per MD. Discharged via foot per request accompanied by family and RN to front entrance.

## 2017-09-13 ENCOUNTER — Encounter: Payer: Self-pay | Admitting: Allergy and Immunology

## 2017-09-13 ENCOUNTER — Ambulatory Visit: Payer: Commercial Managed Care - PPO | Admitting: Internal Medicine

## 2017-09-13 ENCOUNTER — Ambulatory Visit (INDEPENDENT_AMBULATORY_CARE_PROVIDER_SITE_OTHER): Payer: Commercial Managed Care - PPO | Admitting: Allergy and Immunology

## 2017-09-13 VITALS — BP 128/84 | HR 84 | Resp 20 | Ht 69.0 in | Wt 202.2 lb

## 2017-09-13 DIAGNOSIS — T7840XD Allergy, unspecified, subsequent encounter: Secondary | ICD-10-CM | POA: Diagnosis not present

## 2017-09-13 DIAGNOSIS — T783XXD Angioneurotic edema, subsequent encounter: Secondary | ICD-10-CM

## 2017-09-13 MED ORDER — EPINEPHRINE 0.3 MG/0.3ML IJ SOAJ: 0.3000 mg | Freq: Once | INTRAMUSCULAR | 2 refills | Status: AC

## 2017-09-13 MED ORDER — RANITIDINE HCL 150 MG PO TABS: 150.0000 mg | ORAL_TABLET | Freq: Two times a day (BID) | ORAL | 5 refills | Status: DC

## 2017-09-13 MED ORDER — LEVOCETIRIZINE DIHYDROCHLORIDE 5 MG PO TABS: 5.0000 mg | ORAL_TABLET | Freq: Every evening | ORAL | 5 refills | Status: DC

## 2017-09-13 NOTE — Patient Instructions (Addendum)
  Angioedema Angioedema versus anaphylaxis with unclear trigger.  Food allergen skin testing was negative last year despite a positive histamine control.  The patient has not taken an ACE inhibitor since early April 2018.   For now, take levocetirizine (Xyzal) 5 mg twice a day or fexofenadine (Allegra) 180 mg twice a day.  Take ranitidine (Zantac) 150 mg twice daily.  The following labs have been ordered: Tryptase, C4, C1 esterase inhibitor (quantitative and functional), C1q, factor XII, CBC, CMP, and galactose-alpha-1,3-galactose IgE level.  These labs will be drawn 10 to 14 days after his last dose of prednisone.  The patient will be notified with further recommendations after lab results have returned.  A referral will be made to [facility] allergy department for further evaluation.  Should symptoms recur, a  journal is to be kept recording any foods eaten, beverages consumed, medications taken, activities performed, and environmental conditions within a 6 hour period prior to the onset of symptoms. For any symptoms concerning for anaphylaxis, epinephrine is to be administered and 911 is to be called immediately.  A refill prescription has been provided for epinephrine 0.3 mg autoinjector 2 pack along with instructions for its proper administration.

## 2017-09-13 NOTE — Progress Notes (Signed)
Follow-up Note  RE: Robert Reyes MRN: [id number] DOB: [birth year] Date of Office Visit: 09/13/2017  Primary care provider: Ladell Pier, MD Referring provider: Ladell Pier, MD  History of present illness: Robert Reyes is a 52 y.o. male with a history of angioedema/allergic reactions.  He reports that this past Thursday night, June 4th, he went to bed without symptoms and woke up on Friday morning and felt like his "throat was shutting down."  He was taken to the emergency department by his girlfriend and in the ED epinephrine was administered without benefit.  He was intubated on June 5 and extubated on June 7.  No specific medication, food, skin care product, detergent, soap, or other environmental triggers have been identified.  He has not taken an NSAID prior to going to bed at night.  He states that he has had 6 episodes of angioedema over the past 5 or 6 years.  Typically, at the first signs of symptoms he takes diphenhydramine and the symptoms resolve.  He reports that he has been intubated on 4 occasions and required tracheotomy on one occasion.  Prior to this most recent episode, his previous episode had been in April 2018.  He had been on an ACE inhibitor previously, however this medication was discontinued over a year ago. On May 24th, he developed lip swelling after eyebrow laceration repair.  It was assumed that he was having a reaction to the lidocaine.  The lip swelling resolved within a few hours of receiving steroids, diphenhydramine and famotidine.  He did not experience urticaria, cardiopulmonary symptoms, or other GI symptoms.   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  Food allergen skin testing was negative last year despite a positive histamine control.  The patient has not taken an ACE inhibitor since early April 2018.   For now, take levocetirizine (Xyzal) 5 mg twice a day or fexofenadine (Allegra) 180 mg  twice a day.  Take ranitidine (Zantac) 150 mg twice daily.  The following labs have been ordered: Tryptase, C4, C1 esterase inhibitor (quantitative and functional), C1q, factor XII, CBC, CMP, and galactose-alpha-1,3-galactose IgE level.  These labs will be drawn 10 to 14 days after his last dose of prednisone.  The patient will be notified with further recommendations after lab results have returned.  A referral will be made to [facility] allergy department for further evaluation.  Should symptoms recur, a  journal is to be kept recording any foods eaten, beverages consumed, medications taken, activities performed, and environmental conditions within a 6 hour period prior to the onset of symptoms. For any symptoms concerning for anaphylaxis, epinephrine is to be administered and 911 is to be called immediately.  A refill prescription has been provided for epinephrine 0.3 mg autoinjector 2 pack along with instructions for its proper administration.   Meds ordered this encounter  Medications  . EPINEPHrine (AUVI-Q) 0.3 mg/0.3 mL IJ SOAJ injection    Sig: Inject 0.3 mLs (0.3 mg total) into the muscle once for 1 dose. As directed for life-threatening allergic reactions    Dispense:  4 Device    Refill:  2    Please call [phone] for delivery.  Marland Kitchen levocetirizine (XYZAL) 5 MG tablet    Sig: Take 1 tablet (5 mg total) by mouth every evening.    Dispense:  60 tablet    Refill:  5  . ranitidine (ZANTAC) 150 MG tablet    Sig: Take 1 tablet (150 mg total)  by mouth 2 (two) times daily.    Dispense:  60 tablet    Refill:  5    Physical examination: Blood pressure 128/84, pulse 84, resp. rate 20, height 5\' 9"  (1.753 m), weight 202 lb 3.2 oz (91.7 kg), SpO2 99 %.  General: Alert, interactive, in no acute distress. Neck: Supple without lymphadenopathy. Lungs: Clear to auscultation without wheezing, rhonchi or rales. CV: Normal S1, S2 without murmurs. Skin: Warm and dry, without  lesions or rashes.  The following portions of the patient's history were reviewed and updated as appropriate: allergies, current medications, past family history, past medical history, past social history, past surgical history and problem list.  Allergies as of 09/13/2017      Reactions   Lidocaine Swelling   Lisinopril Swelling   Angioedema with Lisinopril/ HCTZ   Ace Inhibitors Swelling   Angioedema      Medication List        Accurate as of 09/13/17 11:59 PM. Always use your most recent med list.          amLODipine 10 MG tablet Commonly known as:  NORVASC Take 1 tablet (10 mg total) by mouth daily.   cloNIDine 0.2 MG tablet Commonly known as:  CATAPRES Take 1 tablet (0.2 mg total) by mouth 2 (two) times daily.   diphenhydrAMINE 25 mg capsule Commonly known as:  BENADRYL Take 25 mg by mouth every 6 (six) hours as needed.   EPINEPHrine 0.3 mg/0.3 mL Soaj injection Commonly known as:  AUVI-Q Inject 0.3 mLs (0.3 mg total) into the muscle once for 1 dose. As directed for life-threatening allergic reactions   ibuprofen 800 MG tablet Commonly known as:  ADVIL,MOTRIN Take 800 mg by mouth every 6 (six) hours as needed for pain.   levocetirizine 5 MG tablet Commonly known as:  XYZAL Take 1 tablet (5 mg total) by mouth every evening.   predniSONE 10 MG tablet Commonly known as:  DELTASONE Prednisone 10 mg tabs Take 4 tabs  daily with food x 4 days, then 3 tabs daily x 4 days, then 2 tabs daily x 4 days, then 1 tab daily x4 days then stop. #40   ranitidine 150 MG tablet Commonly known as:  ZANTAC Take 1 tablet (150 mg total) by mouth 2 (two) times daily.       Allergies  Allergen Reactions  . Lidocaine Swelling  . Lisinopril Swelling    Angioedema with Lisinopril/ HCTZ  . Ace Inhibitors Swelling    Angioedema   Review of systems: Review of systems negative except as noted in HPI / PMHx or noted below: Constitutional: Negative.  HENT: Negative.   Eyes:  Negative.  Respiratory: Negative.   Cardiovascular: Negative.  Gastrointestinal: Negative.  Genitourinary: Negative.  Musculoskeletal: Negative.  Neurological: Negative.  Endo/Heme/Allergies: Negative.  Cutaneous: Negative.  Past Medical History:  Diagnosis Date  . Angioedema   . Hypertension     Family History  Problem Relation Age of Onset  . Urticaria Mother   . Angioedema Brother   . Asthma Neg Hx   . Allergic rhinitis Neg Hx   . Eczema Neg Hx   . Immunodeficiency Neg Hx     Social History   Socioeconomic History  . Marital status: Single    Spouse name: Not on file  . Number of children: Not on file  . Years of education: Not on file  . Highest education level: Not on file  Occupational History  . Not on file  Social  Needs  . Financial resource strain: Not on file  . Food insecurity:    Worry: Not on file    Inability: Not on file  . Transportation needs:    Medical: Not on file    Non-medical: Not on file  Tobacco Use  . Smoking status: Former Smoker    Types: Cigarettes  . Smokeless tobacco: Former Network engineer and Sexual Activity  . Alcohol use: Not Currently    Comment: occ  . Drug use: Not Currently    Types: Marijuana    Comment: stopped 2-3 mths ago  05/2016  . Sexual activity: Yes    Partners: Female  Lifestyle  . Physical activity:    Days per week: Not on file    Minutes per session: Not on file  . Stress: Not on file  Relationships  . Social connections:    Talks on phone: Not on file    Gets together: Not on file    Attends religious service: Not on file    Active member of club or organization: Not on file    Attends meetings of clubs or organizations: Not on file    Relationship status: Not on file  . Intimate partner violence:    Fear of current or ex partner: Not on file    Emotionally abused: Not on file    Physically abused: Not on file    Forced sexual activity: Not on file  Other Topics Concern  . Not on file    Social History Narrative  . Not on file    I appreciate the opportunity to take part in Jeyren's care. Please do not hesitate to contact me with questions.  Sincerely,   R. Edgar Frisk, MD

## 2017-09-14 ENCOUNTER — Encounter: Payer: Self-pay | Admitting: Allergy and Immunology

## 2017-09-14 NOTE — Assessment & Plan Note (Signed)
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  Food allergen skin testing was negative last year despite a positive histamine control.  The patient has not taken an ACE inhibitor since early April 2018.   For now, take levocetirizine (Xyzal) 5 mg twice a day or fexofenadine (Allegra) 180 mg twice a day.  Take ranitidine (Zantac) 150 mg twice daily.  The following labs have been ordered: Tryptase, C4, C1 esterase inhibitor (quantitative and functional), C1q, factor XII, CBC, CMP, and galactose-alpha-1,3-galactose IgE level.  These labs will be drawn 10 to 14 days after his last dose of prednisone.  The patient will be notified with further recommendations after lab results have returned.  A referral will be made to [facility] allergy department for further evaluation.  Should symptoms recur, a  journal is to be kept recording any foods eaten, beverages consumed, medications taken, activities performed, and environmental conditions within a 6 hour period prior to the onset of symptoms. For any symptoms concerning for anaphylaxis, epinephrine is to be administered and 911 is to be called immediately.  A refill prescription has been provided for epinephrine 0.3 mg autoinjector 2 pack along with instructions for its proper administration.

## 2017-09-16 ENCOUNTER — Telehealth: Payer: Self-pay

## 2017-09-16 NOTE — Telephone Encounter (Signed)
Referral has been faxed to [facility]. This office will contact the patient to schedule.

## 2017-09-16 NOTE — Telephone Encounter (Signed)
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 Please attach my note form today as well as previous notes to the referral. Thanks.

## 2017-09-27 ENCOUNTER — Telehealth: Payer: Self-pay | Admitting: Allergy and Immunology

## 2017-09-27 NOTE — Telephone Encounter (Signed)
Patient went to pick up allergy meds Wasn't sure of all the meds he was to pick up and what they were for Please call patient to answer any questions Patient uses [facility] on [address]

## 2017-09-27 NOTE — Telephone Encounter (Signed)
Spoke to patient states he has no questions states he has found out that some of the medications are over the counter

## 2017-12-29 ENCOUNTER — Other Ambulatory Visit: Payer: Self-pay | Admitting: Internal Medicine

## 2017-12-29 DIAGNOSIS — I10 Essential (primary) hypertension: Secondary | ICD-10-CM

## 2017-12-30 ENCOUNTER — Telehealth: Payer: Self-pay | Admitting: Internal Medicine

## 2017-12-30 NOTE — Telephone Encounter (Signed)
Medication was sent today.

## 2018-01-03 MED FILL — AMLODIPINE BESYLATE 10 MG T: 10 | 30 days supply | Qty: 30 | Fill #0

## 2018-01-03 MED FILL — cloNIDine HCL 0.2 MG TABS: 0.2 | 30 days supply | Qty: 60 | Fill #0

## 2018-01-05 ENCOUNTER — Ambulatory Visit: Payer: Self-pay | Attending: Internal Medicine | Admitting: Internal Medicine

## 2018-01-05 ENCOUNTER — Encounter: Payer: Self-pay | Admitting: Internal Medicine

## 2018-01-05 VITALS — BP 144/99 | HR 68 | Temp 98.2°F | Ht 72.0 in | Wt 212.0 lb

## 2018-01-05 DIAGNOSIS — T7840XD Allergy, unspecified, subsequent encounter: Secondary | ICD-10-CM

## 2018-01-05 DIAGNOSIS — G4761 Periodic limb movement disorder: Secondary | ICD-10-CM | POA: Insufficient documentation

## 2018-01-05 DIAGNOSIS — I10 Essential (primary) hypertension: Secondary | ICD-10-CM | POA: Insufficient documentation

## 2018-01-05 DIAGNOSIS — T783XXD Angioneurotic edema, subsequent encounter: Secondary | ICD-10-CM

## 2018-01-05 DIAGNOSIS — D649 Anemia, unspecified: Secondary | ICD-10-CM | POA: Insufficient documentation

## 2018-01-05 DIAGNOSIS — G4733 Obstructive sleep apnea (adult) (pediatric): Secondary | ICD-10-CM | POA: Insufficient documentation

## 2018-01-05 DIAGNOSIS — Z87891 Personal history of nicotine dependence: Secondary | ICD-10-CM | POA: Insufficient documentation

## 2018-01-05 DIAGNOSIS — Z79899 Other long term (current) drug therapy: Secondary | ICD-10-CM | POA: Insufficient documentation

## 2018-01-05 DIAGNOSIS — Z888 Allergy status to other drugs, medicaments and biological substances status: Secondary | ICD-10-CM | POA: Insufficient documentation

## 2018-01-05 MED ORDER — CLONIDINE HCL 0.2 MG PO TABS: 0.2000 mg | ORAL_TABLET | Freq: Two times a day (BID) | ORAL | 6 refills | Status: DC

## 2018-01-05 MED ORDER — ROPINIROLE HCL 0.25 MG PO TABS: 0.2500 mg | ORAL_TABLET | Freq: Every day | ORAL | 1 refills | Status: DC

## 2018-01-05 MED ORDER — AMLODIPINE BESYLATE 10 MG PO TABS: 10.0000 mg | ORAL_TABLET | Freq: Every day | ORAL | 6 refills | Status: DC

## 2018-01-05 NOTE — Progress Notes (Signed)
Patient ID: Robert Reyes, male    DOB: 07/21/1965  MRN: [id number]  CC: Hypertension   Subjective: Robert Reyes is a 52 y.o. male who presents for chronic ds management.  Patient last seen in February. His concerns today include:  HTN, tob dep, recurrent angioedema  Angioedema: Since last visit with me patient was hospitalized in June requiring intubation due to angioedema.  Since then he is followed up with his neurologist Dr. Verlin Fester who had ordered blood work on him including C1 esterase level but patient never went back to have the blood test done. -He was also referred to [facility] allergy clinic and was seen by Dr. Magda Kiel in June.  He wanted to see the results of the blood test panel that was ordered by Dr. Verlin Fester.  Sleep test was done and revealed mild OSA and PLM that interferes with sleep efficiency.  Patient was informed of the results and was told to call the pulmonary sleep clinic to schedule an appointment but he has not done so.  In the interim he has lost insurance.  HTN: Reports compliance with Norvasc and clonidine.  However on further questioning he has been taking the clonidine once a day instead of twice a day.  States that due to his work schedule he is always afraid of taking his evening dose with him because he may misplace it.  He denies any side effects from the medications.  He tries to limit salt in the foods.     Patient Active Problem List   Diagnosis Date Noted  . Allergic reaction 10/12/2016  . Tobacco dependence 08/20/2016  . Hypertension 07/22/2016  . Angioedema 07/02/2016     Current Outpatient Medications on File Prior to Visit  Medication Sig Dispense Refill  . EPINEPHrine 0.3 mg/0.3 mL IJ SOAJ injection Inject 0.3 mLs (0.3 mg total) into the muscle every 3 (three) days as needed (throat swelling).    . diphenhydrAMINE (BENADRYL) 25 mg capsule Take 25 mg by mouth every 6 (six) hours as needed.    Marland Kitchen ibuprofen (ADVIL,MOTRIN) 800 MG tablet  Take 800 mg by mouth every 6 (six) hours as needed for pain.  0   No current facility-administered medications on file prior to visit.     Allergies  Allergen Reactions  . Lidocaine Swelling  . Lisinopril Swelling    Angioedema with Lisinopril/ HCTZ  . Ace Inhibitors Swelling    Angioedema    Social History   Socioeconomic History  . Marital status: Single    Spouse name: Not on file  . Number of children: Not on file  . Years of education: Not on file  . Highest education level: Not on file  Occupational History  . Not on file  Social Needs  . Financial resource strain: Not on file  . Food insecurity:    Worry: Not on file    Inability: Not on file  . Transportation needs:    Medical: Not on file    Non-medical: Not on file  Tobacco Use  . Smoking status: Former Smoker    Types: Cigarettes  . Smokeless tobacco: Former Network engineer and Sexual Activity  . Alcohol use: Not Currently    Comment: occ  . Drug use: Not Currently    Types: Marijuana    Comment: stopped 2-3 mths ago  05/2016  . Sexual activity: Yes    Partners: Female  Lifestyle  . Physical activity:    Days per week: Not on file  Minutes per session: Not on file  . Stress: Not on file  Relationships  . Social connections:    Talks on phone: Not on file    Gets together: Not on file    Attends religious service: Not on file    Active member of club or organization: Not on file    Attends meetings of clubs or organizations: Not on file    Relationship status: Not on file  . Intimate partner violence:    Fear of current or ex partner: Not on file    Emotionally abused: Not on file    Physically abused: Not on file    Forced sexual activity: Not on file  Other Topics Concern  . Not on file  Social History Narrative  . Not on file    Family History  Problem Relation Age of Onset  . Urticaria Mother   . Angioedema Brother   . Asthma Neg Hx   . Allergic rhinitis Neg Hx   . Eczema Neg  Hx   . Immunodeficiency Neg Hx     Past Surgical History:  Procedure Laterality Date  . surgery on genitals    . tracheosotmy      ROS: Review of Systems Negative except as stated above. PHYSICAL EXAM: BP (!) 144/99 (BP Location: Left Arm, Patient Position: Sitting, Cuff Size: Normal)   Pulse 68   Temp 98.2 F (36.8 C) (Oral)   Ht 6' (1.829 m)   Wt 212 lb (96.2 kg)   SpO2 96%   BMI 28.75 kg/m   Physical Exam General appearance - alert, well appearing, and in no distress Mental status - normal mood, behavior, speech, dress, motor activity, and thought processes Neck - supple, no significant adenopathy Chest - clear to auscultation, no wheezes, rales or rhonchi, symmetric air entry Heart - normal rate, regular rhythm, normal S1, S2, no murmurs, rubs, clicks or gallops Extremities - peripheral pulses normal, no pedal edema, no clubbing or cyanosis  Lab Results  Component Value Date   WBC 15.0 (H) 09/10/2017   HGB 12.3 (L) 09/10/2017   HCT 37.4 (L) 09/10/2017   MCV 91.0 09/10/2017   PLT 254 09/10/2017     Chemistry      Component Value Date/Time   NA 141 09/10/2017 0451   K 4.6 09/10/2017 0451   CL 107 09/10/2017 0451   CO2 29 09/10/2017 0451   BUN 24 (H) 09/10/2017 0451   CREATININE 1.04 09/10/2017 0451      Component Value Date/Time   CALCIUM 8.9 09/10/2017 0451   ALKPHOS 60 08/24/2016 1855   AST 19 08/24/2016 1855   ALT 27 08/24/2016 1855   BILITOT 0.8 08/24/2016 1855      ASSESSMENT AND PLAN: 1. Essential hypertension Not at goal.  Advised patient that the clonidine needs to be taken twice a day.  He will start doing so. - cloNIDine (CATAPRES) 0.2 MG tablet; Take 1 tablet (0.2 mg total) by mouth 2 (two) times daily.  Dispense: 60 tablet; Refill: 6 - amLODipine (NORVASC) 10 MG tablet; Take 1 tablet (10 mg total) by mouth daily.  Dispense: 30 tablet; Refill: 6  2. Anemia, unspecified type Anemia on last CBC done in the hospital.  We will recheck this  along with iron studies to make sure that his PLM is not due to iron deficiency though this is more associated with RLS - CBC - Iron, TIBC and Ferritin Panel  3. OSA (obstructive sleep apnea) 4. PLMD (periodic limb  movement disorder) Discussed diagnosis with patient.  He is trying to complete forms to apply for the orange card/cone discount.  If he is approved we can consider sending him for titration study. In the meantime since the OSA is mild, I recommended avoiding sleeping on his back and avoiding sedatives and excessive alcohol at night. -Patient feels that the PLMD is significant because he wakes up at nights feeling his legs moving.  We discussed trying him on a low-dose of Requip.  I went over possible side effects of the medication including drop attacks and compulsive behavior.  Patient advised to let me know if he experience any of these side effects.  5. Angioedema, subsequent encounter I have asked our lab tech to release the orders in the system from Dr. Verlin Fester so that patient can have them drawn here today. - Factor 12 assay  6. Allergic reaction, subsequent encounter - Tryptase  Patient was given the opportunity to ask questions.  Patient verbalized understanding of the plan and was able to repeat key elements of the plan.   Orders Placed This Encounter  Procedures  . CBC  . Iron, TIBC and Ferritin Panel     Requested Prescriptions   Signed Prescriptions Disp Refills  . cloNIDine (CATAPRES) 0.2 MG tablet 60 tablet 6    Sig: Take 1 tablet (0.2 mg total) by mouth 2 (two) times daily.  Marland Kitchen amLODipine (NORVASC) 10 MG tablet 30 tablet 6    Sig: Take 1 tablet (10 mg total) by mouth daily.  Marland Kitchen rOPINIRole (REQUIP) 0.25 MG tablet 30 tablet 1    Sig: Take 1 tablet (0.25 mg total) by mouth at bedtime.    Return in about 3 years (around 01/05/2021).  Karle Plumber, MD, FACP

## 2018-01-05 NOTE — Patient Instructions (Signed)
Please take clonidine twice a day as prescribed.  Continue to limit salt in the foods.  Once you are approved for the orange card/cone discount card please let me know so that we can send you for a titration study for your sleep apnea.

## 2018-01-05 NOTE — Progress Notes (Signed)
Patient is here for hypertension check-up.  Pt. Is in the process of reapplying for CAFA and orange card.   Patient asked if [facility] had faxed or input his sleep study her had done in July.

## 2018-01-06 ENCOUNTER — Telehealth: Payer: Self-pay

## 2018-01-06 ENCOUNTER — Telehealth: Payer: Self-pay | Admitting: Internal Medicine

## 2018-01-06 LAB — CBC
HEMATOCRIT: 42.8 % (ref 37.5–51.0)
Hemoglobin: 14.4 g/dL (ref 13.0–17.7)
MCH: 30.7 pg (ref 26.6–33.0)
MCHC: 33.6 g/dL (ref 31.5–35.7)
MCV: 91 fL (ref 79–97)
Platelets: 239 10*3/uL (ref 150–450)
RBC: 4.69 x10E6/uL (ref 4.14–5.80)
RDW: 14.2 % (ref 12.3–15.4)
WBC: 5.4 10*3/uL (ref 3.4–10.8)

## 2018-01-06 LAB — IRON,TIBC AND FERRITIN PANEL
FERRITIN: 193 ng/mL (ref 30–400)
IRON SATURATION: 18 % (ref 15–55)
IRON: 62 ug/dL (ref 38–169)
Total Iron Binding Capacity: 351 ug/dL (ref 250–450)
UIBC: 289 ug/dL (ref 111–343)

## 2018-01-06 NOTE — Telephone Encounter (Signed)
CMA attempt to reach patient to inform on results.  No answer and left a VM for patient to call back.  If patient call back, please inform:  Let patient know that he is no longer anemic.  His iron level is also normal.

## 2018-01-06 NOTE — Telephone Encounter (Signed)
Thank You.

## 2018-01-06 NOTE — Telephone Encounter (Signed)
Nurse spoke with the patient and was able to give him clarification on his results

## 2018-01-06 NOTE — Telephone Encounter (Signed)
Patient would like clarification regarding his recent lab results. Please follow up with patient.

## 2018-01-06 NOTE — Telephone Encounter (Signed)
CMA spoke to patient and clarified his lab. Patient understood.

## 2018-01-06 NOTE — Telephone Encounter (Signed)
Pt called to request his lab results, verified DOB and gave me verbal authorization to give him his results. He received note left by his nurse. Had no further questions or concerns

## 2018-01-06 NOTE — Telephone Encounter (Signed)
-----   Message from Ladell Pier, MD sent at 01/06/2018  8:35 AM EDT ----- Let patient know that he is no longer anemic.  His iron level is also normal.

## 2018-01-08 LAB — FACTOR 12 ASSAY: Factor XII Activity: 99 % (ref 50–150)

## 2018-01-08 LAB — TRYPTASE: Tryptase: 5.3 ug/L (ref 2.2–13.2)

## 2018-02-20 MED FILL — AMLODIPINE BESYLATE 10 MG T: 10 | 30 days supply | Qty: 30 | Fill #0

## 2018-02-20 MED FILL — cloNIDine HCL 0.2 MG TABS: 0.2 | 30 days supply | Qty: 60 | Fill #0

## 2018-04-07 ENCOUNTER — Ambulatory Visit: Payer: Self-pay | Admitting: Internal Medicine

## 2018-04-11 MED FILL — AMLODIPINE BESYLATE 10 MG T: 10 | 30 days supply | Qty: 30 | Fill #1

## 2018-04-11 MED FILL — cloNIDine HCL 0.2 MG TABS: 0.2 | 30 days supply | Qty: 60 | Fill #1

## 2018-04-23 ENCOUNTER — Encounter (HOSPITAL_COMMUNITY): Payer: Self-pay | Admitting: *Deleted

## 2018-04-23 ENCOUNTER — Other Ambulatory Visit: Payer: Self-pay

## 2018-04-23 ENCOUNTER — Observation Stay (HOSPITAL_COMMUNITY)
Admission: EM | Admit: 2018-04-23 | Discharge: 2018-04-24 | Disposition: A | Discharge status: Home / Self Care | Payer: Self-pay | Attending: Internal Medicine | Admitting: Internal Medicine

## 2018-04-23 DIAGNOSIS — X58XXXA Exposure to other specified factors, initial encounter: Secondary | ICD-10-CM | POA: Insufficient documentation

## 2018-04-23 DIAGNOSIS — Z888 Allergy status to other drugs, medicaments and biological substances status: Secondary | ICD-10-CM | POA: Insufficient documentation

## 2018-04-23 DIAGNOSIS — T783XXA Angioneurotic edema, initial encounter: Principal | ICD-10-CM | POA: Diagnosis present

## 2018-04-23 DIAGNOSIS — Z87891 Personal history of nicotine dependence: Secondary | ICD-10-CM | POA: Insufficient documentation

## 2018-04-23 DIAGNOSIS — Z79899 Other long term (current) drug therapy: Secondary | ICD-10-CM | POA: Insufficient documentation

## 2018-04-23 DIAGNOSIS — I10 Essential (primary) hypertension: Secondary | ICD-10-CM | POA: Diagnosis present

## 2018-04-23 DIAGNOSIS — T783XXD Angioneurotic edema, subsequent encounter: Secondary | ICD-10-CM

## 2018-04-23 DIAGNOSIS — Z884 Allergy status to anesthetic agent status: Secondary | ICD-10-CM | POA: Insufficient documentation

## 2018-04-23 MED ORDER — DIPHENHYDRAMINE HCL 50 MG/ML IJ SOLN
25.0000 mg | Freq: Once | INTRAMUSCULAR | Status: AC
  Administered 2018-04-23: 25 mg via INTRAVENOUS
  Filled 2018-04-23: qty 1

## 2018-04-23 MED ORDER — SODIUM CHLORIDE 0.9 % IV SOLN: 250.0000 mL | INTRAVENOUS | Status: DC | PRN

## 2018-04-23 MED ORDER — ONDANSETRON HCL 4 MG PO TABS: 4.0000 mg | ORAL_TABLET | Freq: Four times a day (QID) | ORAL | Status: DC | PRN

## 2018-04-23 MED ORDER — HYDRALAZINE HCL 20 MG/ML IJ SOLN: 10.0000 mg | Freq: Four times a day (QID) | INTRAMUSCULAR | Status: DC | PRN

## 2018-04-23 MED ORDER — FAMOTIDINE IN NACL 20-0.9 MG/50ML-% IV SOLN
20.0000 mg | Freq: Two times a day (BID) | INTRAVENOUS | Status: DC
  Administered 2018-04-23 (×2): 20 mg via INTRAVENOUS
  Filled 2018-04-23 (×3): qty 50

## 2018-04-23 MED ORDER — FAMOTIDINE IN NACL 20-0.9 MG/50ML-% IV SOLN
20.0000 mg | Freq: Once | INTRAVENOUS | Status: AC
  Administered 2018-04-23: 20 mg via INTRAVENOUS
  Filled 2018-04-23: qty 50

## 2018-04-23 MED ORDER — EPINEPHRINE 0.3 MG/0.3ML IJ SOAJ
0.3000 mg | Freq: Once | INTRAMUSCULAR | Status: AC
  Administered 2018-04-23: 0.3 mg via INTRAMUSCULAR
  Filled 2018-04-23: qty 0.3

## 2018-04-23 MED ORDER — AMLODIPINE BESYLATE 10 MG PO TABS
10.0000 mg | ORAL_TABLET | Freq: Every day | ORAL | Status: DC
  Administered 2018-04-23 – 2018-04-24 (×2): 10 mg via ORAL
  Filled 2018-04-23: qty 2
  Filled 2018-04-23: qty 1

## 2018-04-23 MED ORDER — POLYETHYLENE GLYCOL 3350 17 G PO PACK: 17.0000 g | PACK | Freq: Every day | ORAL | Status: DC | PRN

## 2018-04-23 MED ORDER — DIPHENHYDRAMINE HCL 50 MG/ML IJ SOLN: 25.0000 mg | INTRAMUSCULAR | Status: DC | PRN

## 2018-04-23 MED ORDER — ACETAMINOPHEN 500 MG PO TABS: 500.0000 mg | ORAL_TABLET | Freq: Four times a day (QID) | ORAL | Status: DC | PRN

## 2018-04-23 MED ORDER — CLONIDINE HCL 0.1 MG PO TABS
0.2000 mg | ORAL_TABLET | Freq: Two times a day (BID) | ORAL | Status: DC
  Administered 2018-04-23 – 2018-04-24 (×3): 0.2 mg via ORAL
  Filled 2018-04-23 (×3): qty 2

## 2018-04-23 MED ORDER — ONDANSETRON HCL 4 MG/2ML IJ SOLN: 4.0000 mg | Freq: Four times a day (QID) | INTRAMUSCULAR | Status: DC | PRN

## 2018-04-23 MED ORDER — EPINEPHRINE 0.3 MG/0.3ML IJ SOAJ
0.3000 mg | INTRAMUSCULAR | Status: DC | PRN
  Filled 2018-04-23: qty 0.3

## 2018-04-23 MED ORDER — METHYLPREDNISOLONE SODIUM SUCC 125 MG IJ SOLR
125.0000 mg | Freq: Once | INTRAMUSCULAR | Status: AC
  Administered 2018-04-23: 125 mg via INTRAVENOUS
  Filled 2018-04-23: qty 2

## 2018-04-23 MED ORDER — METHYLPREDNISOLONE SODIUM SUCC 125 MG IJ SOLR
60.0000 mg | Freq: Four times a day (QID) | INTRAMUSCULAR | Status: DC
  Administered 2018-04-23 – 2018-04-24 (×4): 60 mg via INTRAVENOUS
  Filled 2018-04-23 (×4): qty 2

## 2018-04-23 MED ORDER — SODIUM CHLORIDE 0.9 % IV SOLN
INTRAVENOUS | Status: DC
  Administered 2018-04-23: 14:00:00 via INTRAVENOUS

## 2018-04-23 MED ORDER — SODIUM CHLORIDE 0.9% FLUSH
3.0000 mL | Freq: Two times a day (BID) | INTRAVENOUS | Status: DC
  Administered 2018-04-23 – 2018-04-24 (×2): 3 mL via INTRAVENOUS

## 2018-04-23 MED ORDER — SODIUM CHLORIDE 0.9% FLUSH: 3.0000 mL | INTRAVENOUS | Status: DC | PRN

## 2018-04-23 NOTE — ED Triage Notes (Signed)
Pt presents with tongue swelling that began approx 1 hour ago, hx of angio edema, took benadryl 25 mg approx 0600.

## 2018-04-23 NOTE — ED Provider Notes (Signed)
MSE was initiated and I personally evaluated the patient and placed orders (if any) at  6:52 AM on April 23, 2018.  Angioedema. Asymmetric tongue swelling. VS WNL. No stridor. No resp distress. Protecting airway.  Solumedrol/pepcid/benadryl ordered.   The patient appears stable so that the remainder of the MSE may be completed by another provider.    Kanon Novosel, Corene Cornea, MD 04/23/18 [phone]

## 2018-04-23 NOTE — ED Notes (Signed)
Bed: WA13 Expected date:  Expected time:  Means of arrival:  Comments: Triage 2 

## 2018-04-23 NOTE — H&P (Addendum)
[facility] History and Physical  Lucia Mccreadie PJK:[id number] DOB: 12-Jul-1965 DOA: 04/23/2018  Referring physician: ED  PCP: Ladell Pier, MD   Chief Complaint: Swelling of his tongue  HPI: Robert Reyes is a 53 y.o. male with past medical history of angioedema, hypertension being seen at [facility] for angioedema in the past presented to the hospital with complaints of swelling of his tongue.  Patient stated that he had swelling of his tongue when he woke up around 6 AM this morning and has had recurrent episodes of angioedema in the past.  In the past, patient also needed an intubation.  This history of allergy to lidocaine but his angioedema etiology has not been ascertained.  Patient had a muffling of his voice when he came to the hospital with some left sub-lingual swelling.  He received epinephrine in the ED with mild improvement with his voice.  Patient has had multiple airway issues in the past and prior history of multiple intubations including one episode of tracheostomy for the same.  The time of my interview patient had muffled voice but did not have any stridor or wheezing or increased work of breathing.   ED Course: In the ED, patient patient received epinephrine, Solu-Medrol, IV Benadryl, Pepcid.  Patient remained stable for almost 4 hours but still had persistent symptoms so hospitalist team was consulted.  ENT Dr. Wilburn Cornelia was consulted from the ED who recommended observation in stepdown unit and will be available for intubation and airway stabilization if needed.  No further medication orders were recommended by ENT.  Review of Systems:  All systems were reviewed and were negative unless otherwise mentioned in the HPI  Past Medical History:  Diagnosis Date  . Angioedema   . Hypertension    Past Surgical History:  Procedure Laterality Date  . surgery on genitals    . tracheosotmy      Social History:  reports that he has quit smoking. His smoking use  included cigarettes. He has quit using smokeless tobacco. He reports previous alcohol use. He reports previous drug use. Drug: Marijuana.  Allergies  Allergen Reactions  . Lidocaine Swelling  . Lisinopril Swelling and Other (See Comments)    Angioedema with Lisinopril/ HCTZ  . Ace Inhibitors Swelling and Other (See Comments)    Angioedema    Family History  Problem Relation Age of Onset  . Urticaria Mother   . Angioedema Brother   . Asthma Neg Hx   . Allergic rhinitis Neg Hx   . Eczema Neg Hx   . Immunodeficiency Neg Hx      Prior to Admission medications   Medication Sig Start Date End Date Taking? Authorizing Provider  acetaminophen (TYLENOL) 500 MG tablet Take 500 mg by mouth every 6 (six) hours as needed for moderate pain or headache.   Yes [provider]  amLODipine (NORVASC) 10 MG tablet Take 1 tablet (10 mg total) by mouth daily. 01/05/18  Yes Ladell Pier, MD  cloNIDine (CATAPRES) 0.2 MG tablet Take 1 tablet (0.2 mg total) by mouth 2 (two) times daily. 01/05/18  Yes Ladell Pier, MD  diphenhydrAMINE (BENADRYL) 25 mg capsule Take 25 mg by mouth daily as needed for allergies.    Yes [provider]  EPINEPHrine 0.3 mg/0.3 mL IJ SOAJ injection Inject 0.3 mg into the muscle as needed for anaphylaxis.  09/10/17  Yes [provider]    Physical Exam: Vitals:   04/23/18 1030 04/23/18 1045 04/23/18 1100 04/23/18 1139  BP: Marland Kitchen)  148/106 (!) 151/102 (!) 161/96 (!) 156/102  Pulse: 76 87 82 83  Resp: 17 16 17 14   Temp:      TempSrc:      SpO2: 99% 99% 99% 96%  Weight:      Height:       Wt Readings from Last 3 Encounters:  04/23/18 95.3 kg  01/05/18 96.2 kg  09/13/17 91.7 kg   Body mass index is 29.29 kg/m.  General:  Average built, not in obvious distress, no stridor wheezing or respiratory distress HENT: Normocephalic, pupils equally reacting to light and accommodation.  No scleral pallor or icterus noted.  Gross tongue swelling noted  with muffled voice.  Mild swelling of the left submental area. Chest:  Clear breath sounds.  Diminished breath sounds bilaterally. No crackles or wheezes.  CVS: S1 &S2 heard. No murmur.  Regular rate and rhythm. Abdomen: Soft, nontender, nondistended.  Bowel sounds are heard.  Liver is not palpable, no abdominal mass palpated Extremities: No cyanosis, clubbing or edema.  Peripheral pulses are palpable. Psych: Alert, awake and oriented, normal mood CNS:  No cranial nerve deficits.  Power equal in all extremities.   No cerebellar signs.   Skin: Warm and dry.  No rashes noted.  Labs on Admission:  Basic Metabolic Panel: No results for input(s): NA, K, CL, CO2, GLUCOSE, BUN, CREATININE, CALCIUM, MG, PHOS in the last 168 hours. Liver Function Tests: No results for input(s): AST, ALT, ALKPHOS, BILITOT, PROT, ALBUMIN in the last 168 hours. No results for input(s): LIPASE, AMYLASE in the last 168 hours. No results for input(s): AMMONIA in the last 168 hours. CBC: No results for input(s): WBC, NEUTROABS, HGB, HCT, MCV, PLT in the last 168 hours. Cardiac Enzymes: No results for input(s): CKTOTAL, CKMB, CKMBINDEX, TROPONINI in the last 168 hours.  BNP (last 3 results) No results for input(s): BNP in the last 8760 hours.  ProBNP (last 3 results) No results for input(s): PROBNP in the last 8760 hours.  CBG: No results for input(s): GLUCAP in the last 168 hours.   Radiological Exams on Admission: No results found.  EKG: Not available for review  Assessment/Plan Active Problems:   Angioedema   Hypertension  Angioedema with tongue swelling.  Previous work-up at [facility] with uncertain etiology.  Patient has slightly improved with epinephrine, Solu-Medrol Benadryl and Pepcid.  His condition has remained stable but has persistent angioedema.  Patient does have history of intubations in the past including tracheostomy.  He is high risk for airway deterioration.  He is not in acute respiratory  distress.  ENT has been notified from the ED and will be available for intubation and other airway management if needed.  We will continue on Solu-Medrol high-dose, IV Benadryl, Pepcid.  Will closely monitor in the stepdown unit.  Consider epinephrine as needed for worsening symptoms.  History of hypertension.  Resume outpatient medication.  Will closely monitor.  Will watch closely on epinephrine.   Consultant: ENT Dr. Wilburn Cornelia was consulted, I also spoke with Dr. Melvyn Novas critical care about the patient in case the patient would need critical care and airway management/intubation.  I spoke at length with the ED provider about the potential airway compromise.  Code Status: Full code  DVT Prophylaxis: Low risk  Antibiotics: None  Family Communication:  Patients' condition and plan of care including tests being ordered have been discussed with the patient who indicate understanding and agree with the plan.  Disposition Plan: Home  Severity of Illness: The appropriate  patient status for this patient is OBSERVATION. Observation status is judged to be reasonable and necessary in order to provide the required intensity of service to ensure the patient's safety. The patient's presenting symptoms, physical exam findings, and initial radiographic and laboratory data in the context of their medical condition is felt to place them at decreased risk for further clinical deterioration. Furthermore, it is anticipated that the patient will be medically stable for discharge from the hospital within 2 midnights of admission. The following factors support the patient status of observation.    Signed, Flora Lipps, MD [facility] 04/23/2018

## 2018-04-23 NOTE — ED Notes (Signed)
Report given to [facility] for 2W, Room 1231.

## 2018-04-23 NOTE — ED Notes (Signed)
ED TO INPATIENT HANDOFF REPORT  Name/Age/Gender Robert Reyes 53 y.o. male  Code Status    Code Status Orders  (From admission, onward)         Start     Ordered   04/23/18 1312  Full code  Continuous     04/23/18 1312        Code Status History    Date Active Date Inactive Code Status Order ID Comments User Context   09/07/2017 1509 09/10/2017 1735 Full Code [id number]  Kipp Brood, MD ED   08/24/2016 2310 08/26/2016 1409 Full Code [id number]  Norval Morton, MD ED   07/02/2016 1007 07/05/2016 2035 Full Code [id number]  Raylene Miyamoto, MD ED      Home/SNF/Other Home  Chief Complaint tongue swelling  Level of Care/Admitting Diagnosis ED Disposition    ED Disposition Condition [facility] Area: [facility] [100102]  Level of Care: Stepdown [14]  Admit to SDU based on following criteria: Respiratory Distress:  Frequent assessment and/or intervention to maintain adequate ventilation/respiration, pulmonary toilet, and respiratory treatment.  Diagnosis: Angioedema [701779]  Admitting Physician: Flora Lipps [[id number]]  Attending Physician: Flora Lipps [[id number]]  PT Class (Do Not Modify): Observation [104]  PT Acc Code (Do Not Modify): Observation [10022]       Medical History Past Medical History:  Diagnosis Date  . Angioedema   . Hypertension     Allergies Allergies  Allergen Reactions  . Lidocaine Swelling  . Lisinopril Swelling and Other (See Comments)    Angioedema with Lisinopril/ HCTZ  . Ace Inhibitors Swelling and Other (See Comments)    Angioedema    IV Location/Drains/Wounds Patient Lines/Drains/Airways Status   Active Line/Drains/Airways    Name:   Placement date:   Placement time:   Site:   Days:   Peripheral IV 04/23/18 Left Antecubital   04/23/18    0658    Antecubital   less than 1   Peripheral IV 04/23/18 Left Hand   04/23/18    1353    Hand   less than 1          Labs/Imaging No  results found for this or any previous visit (from the past 72 hour(s)). No results found. None  Pending Labs [employer] (From admission, onward)    Start     Ordered   Signed and Occupational hygienist morning,   R     Signed and Held   Signed and Held  CBC  Tomorrow morning,   R     Signed and Held          Vitals/Pain Today's Vitals   04/23/18 1400 04/23/18 1430 04/23/18 1500 04/23/18 1530  BP: (!) 154/104 (!) 142/95 (!) 128/91 (!) 118/91  Pulse: 87 86 81 75  Resp: (!) 23 (!) 21 15 17   Temp:      TempSrc:      SpO2: 95% 96% 95% 95%  Weight:      Height:      PainSc:        Isolation Precautions No active isolations  Medications Medications  EPINEPHrine (EPI-PEN) injection 0.3 mg (has no administration in time range)  cloNIDine (CATAPRES) tablet 0.2 mg (0.2 mg Oral Given 04/23/18 1341)  amLODipine (NORVASC) tablet 10 mg (10 mg Oral Given 04/23/18 1342)  ondansetron (ZOFRAN) tablet 4 mg (has no administration in time range)    Or  ondansetron (ZOFRAN) injection 4 mg (has  no administration in time range)  methylPREDNISolone sodium succinate (SOLU-MEDROL) 125 mg/2 mL injection 60 mg (60 mg Intravenous Given 04/23/18 1355)  diphenhydrAMINE (BENADRYL) injection 25 mg (has no administration in time range)  famotidine (PEPCID) IVPB 20 mg premix (20 mg Intravenous New Bag/Given 04/23/18 1358)  hydrALAZINE (APRESOLINE) injection 10 mg (has no administration in time range)  0.9 %  sodium chloride infusion ( Intravenous New Bag/Given 04/23/18 1427)  methylPREDNISolone sodium succinate (SOLU-MEDROL) 125 mg/2 mL injection 125 mg (125 mg Intravenous Given 04/23/18 0707)  diphenhydrAMINE (BENADRYL) injection 25 mg (25 mg Intravenous Given 04/23/18 0710)  famotidine (PEPCID) IVPB 20 mg premix (0 mg Intravenous Stopped 04/23/18 0750)  EPINEPHrine (EPI-PEN) injection 0.3 mg (0.3 mg Intramuscular Given 04/23/18 0729)    Mobility walks

## 2018-04-23 NOTE — ED Notes (Signed)
Bed: WTR5 Expected date:  Expected time:  Means of arrival:  Comments: 

## 2018-04-23 NOTE — ED Provider Notes (Signed)
[facility] DEPT Provider Note   CSN: [id number] Arrival date & time: 04/23/18  4665     History   Chief Complaint Chief Complaint  Patient presents with  . Oral Swelling    HPI Robert Reyes is a 53 y.o. male.  HPI   He presents for evaluation of recurrent swelling of his tongue.  Onset this morning after awakening around 6 AM.  His wife gave him a single Benadryl, prior to arrival.  Similar problems the past recurrently has been comprehensively evaluated without etiology found.  Reported possible allergy to epinephrine, felt to worsen swelling at a time when he was ultimately intubated.  Also reported allergy to lidocaine.  No known provoking agents at this time.  No recent illnesses including fever, chills, vomiting or dizziness.  There are no other known modifying factors.  Past Medical History:  Diagnosis Date  . Angioedema   . Hypertension     Patient Active Problem List   Diagnosis Date Noted  . Allergic reaction 10/12/2016  . Tobacco dependence 08/20/2016  . Hypertension 07/22/2016  . Angioedema 07/02/2016    Past Surgical History:  Procedure Laterality Date  . surgery on genitals    . tracheosotmy          Home Medications    Prior to Admission medications   Medication Sig Start Date End Date Taking? Authorizing Provider  acetaminophen (TYLENOL) 500 MG tablet Take 500 mg by mouth every 6 (six) hours as needed for moderate pain or headache.   Yes [provider]  amLODipine (NORVASC) 10 MG tablet Take 1 tablet (10 mg total) by mouth daily. 01/05/18  Yes Ladell Pier, MD  cloNIDine (CATAPRES) 0.2 MG tablet Take 1 tablet (0.2 mg total) by mouth 2 (two) times daily. 01/05/18  Yes Ladell Pier, MD  diphenhydrAMINE (BENADRYL) 25 mg capsule Take 25 mg by mouth daily as needed for allergies.    Yes [provider]  EPINEPHrine 0.3 mg/0.3 mL IJ SOAJ injection Inject 0.3 mg into the muscle as needed  for anaphylaxis.  09/10/17  Yes [provider]    Family History Family History  Problem Relation Age of Onset  . Urticaria Mother   . Angioedema Brother   . Asthma Neg Hx   . Allergic rhinitis Neg Hx   . Eczema Neg Hx   . Immunodeficiency Neg Hx     Social History Social History   Tobacco Use  . Smoking status: Former Smoker    Types: Cigarettes  . Smokeless tobacco: Former Network engineer Use Topics  . Alcohol use: Not Currently    Comment: occ  . Drug use: Not Currently    Types: Marijuana    Comment: stopped 2-3 mths ago  05/2016     Allergies   Lidocaine; Lisinopril; and Ace inhibitors   Review of Systems Review of Systems  All other systems reviewed and are negative.    Physical Exam Updated Vital Signs BP (!) 161/96   Pulse 82   Temp 98.8 F (37.1 C) (Oral)   Resp 17   Ht 5\' 11"  (1.803 m)   Wt 95.3 kg   SpO2 99%   BMI 29.29 kg/m   Physical Exam Vitals signs and nursing note reviewed.  Constitutional:      Appearance: He is well-developed.  HENT:     Head: Normocephalic and atraumatic.     Right Ear: External ear normal.     Left Ear: External ear normal.  Nose: Nose normal.     Mouth/Throat:     Comments: Tongue swollen, primarily sublingual swelling left-sided.  Slightly muffled voice.  No stridor. Eyes:     Conjunctiva/sclera: Conjunctivae normal.     Pupils: Pupils are equal, round, and reactive to light.  Neck:     Musculoskeletal: Normal range of motion and neck supple.     Trachea: Phonation normal.  Cardiovascular:     Rate and Rhythm: Normal rate and regular rhythm.     Heart sounds: Normal heart sounds.  Pulmonary:     Effort: Pulmonary effort is normal.     Breath sounds: Normal breath sounds. No stridor. No rhonchi.  Abdominal:     Palpations: Abdomen is soft.     Tenderness: There is no abdominal tenderness.  Musculoskeletal: Normal range of motion.  Skin:    General: Skin is warm and dry.  Neurological:       Mental Status: He is alert and oriented to person, place, and time.     Cranial Nerves: No cranial nerve deficit.     Sensory: No sensory deficit.     Motor: No abnormal muscle tone.     Coordination: Coordination normal.  Psychiatric:        Behavior: Behavior normal.        Thought Content: Thought content normal.        Judgment: Judgment normal.      ED Treatments / Results  Labs (all labs ordered are listed, but only abnormal results are displayed) Labs Reviewed - No data to display  EKG None  Radiology No results found.  Procedures .Critical Care Performed by: Daleen Bo, MD Authorized by: Daleen Bo, MD   Critical care provider statement:    Critical care time (minutes):  70   Critical care start time:  04/23/2018 7:05 AM   Critical care end time:  04/23/2018 11:23 AM   Critical care time was exclusive of:  Separately billable procedures and treating other patients   Critical care was necessary to treat or prevent imminent or life-threatening deterioration of the following conditions:  Respiratory failure   Critical care was time spent personally by me on the following activities:  Blood draw for specimens, development of treatment plan with patient or surrogate, discussions with consultants, evaluation of patient's response to treatment, examination of patient, obtaining history from patient or surrogate, ordering and performing treatments and interventions, ordering and review of laboratory studies, pulse oximetry, re-evaluation of patient's condition, review of old charts and ordering and review of radiographic studies   (including critical care time)  Medications Ordered in ED Medications  methylPREDNISolone sodium succinate (SOLU-MEDROL) 125 mg/2 mL injection 125 mg (125 mg Intravenous Given 04/23/18 0707)  diphenhydrAMINE (BENADRYL) injection 25 mg (25 mg Intravenous Given 04/23/18 0710)  famotidine (PEPCID) IVPB 20 mg premix (0 mg Intravenous Stopped  04/23/18 0750)  EPINEPHrine (EPI-PEN) injection 0.3 mg (0.3 mg Intramuscular Given 04/23/18 0729)     Initial Impression / Assessment and Plan / ED Course  I have reviewed the triage vital signs and the nursing notes.  Pertinent labs & imaging results that were available during my care of the patient were reviewed by me and considered in my medical decision making (see chart for details).  Clinical Course as of Apr 23 1126  Sun Apr 23, 2018  0733 Chart review and prolonged discussion with wife and patient regarding use of epinephrine and its importance to reverse allergic reactions.  They agreed to proceed with  epinephrine injection.   [EW]  1308 At this time patient reports he can "breathe a little better," and he has slightly less muffling of his voice.  Continues to have left sublingual swelling.  Neck is soft, slightly swollen beneath the left mandible.   [EW]  [phone] At this time there is marginal improvement in swelling, and his voice continues to sound less muffled when he talks.  His wife feels like his swelling is moderately improved.   [EW]  4696 He continues to be able to phonate, and continues to have both lingual and sub-lingual swelling.  No appreciable difference as compared to 8:38 AM.  Angioedema process does not appear to be advancing at this time.   [EW]  1101 Requested callback from ENT for assistance with disposition planning.   [EW]  2952 Case discussed with Dr. Wilburn Cornelia, ENT, who recommends continue current treatment, observe, and he is available for assistance with intubation and airway stabilization, as needed.   [EW]    Clinical Course User Index [EW] Daleen Bo, MD     Patient Vitals for the past 24 hrs:  BP Temp Temp src Pulse Resp SpO2 Height Weight  04/23/18 1100 (!) 161/96 - - 82 17 99 % - -  04/23/18 1045 (!) 151/102 - - 87 16 99 % - -  04/23/18 1030 (!) 148/106 - - 76 17 99 % - -  04/23/18 1015 (!) 144/101 - - 78 19 99 % - -  04/23/18 1000 (!)  135/97 - - 71 13 99 % - -  04/23/18 0945 (!) 149/101 - - 78 17 99 % - -  04/23/18 0930 (!) 146/100 - - 79 19 96 % - -  04/23/18 0915 (!) 143/98 - - 78 14 96 % - -  04/23/18 0900 (!) 131/97 - - 78 17 96 % - -  04/23/18 0845 (!) 134/100 - - 79 15 96 % - -  04/23/18 0830 (!) 135/95 - - 89 14 96 % - -  04/23/18 0815 (!) 137/99 - - 92 16 95 % - -  04/23/18 0800 (!) 154/97 - - 92 16 96 % - -  04/23/18 0745 (!) 159/97 - - 94 18 97 % - -  04/23/18 0730 (!) 151/97 - - (!) 102 18 96 % - -  04/23/18 0700 (!) 155/103 - - 94 18 97 % - -  04/23/18 0645 - - - - - - 5\' 11"  (1.803 m) 95.3 kg  04/23/18 0644 (!) 157/107 98.8 F (37.1 C) Oral 98 18 100 % - -    11:00 AM Reevaluation with update and discussion. After initial assessment and treatment, an updated evaluation reveals patient family members understand he will require hospitalization with specialty consultation.  They are agreeable to the plan. Daleen Bo   Medical Decision Making: Idiopathic angioedema oral airway, etiology not clear.  This is a recurrent problem.  Patient treated with H1 and H2 blockers, steroids, epinephrine, with close observation.  As of 11 AM, he does not require intubation.  ENT consultation, for help with disposition and management plan.  CRITICAL CARE- yes Performed by: Daleen Bo  Nursing Notes Reviewed/ Care Coordinated Applicable Imaging Reviewed Interpretation of Laboratory Data incorporated into ED treatment  11:24 AM-Consult complete with hospitalist. Patient case explained and discussed.  He agrees to admit patient for further evaluation and treatment. Call ended at 11:35 AM  Plan: Admit  Final Clinical Impressions(s) / ED Diagnoses   Final diagnoses:  Angioedema, initial  encounter    ED Discharge Orders    None       Daleen Bo, MD 04/23/18 1215

## 2018-04-24 DIAGNOSIS — T783XXA Angioneurotic edema, initial encounter: Secondary | ICD-10-CM

## 2018-04-24 LAB — BASIC METABOLIC PANEL
Anion gap: 9 (ref 5–15)
BUN: 19 mg/dL (ref 6–20)
CO2: 22 mmol/L (ref 22–32)
Calcium: 9.3 mg/dL (ref 8.9–10.3)
Chloride: 108 mmol/L (ref 98–111)
Creatinine, Ser: 1.2 mg/dL (ref 0.61–1.24)
GFR calc Af Amer: >60 mL/min (ref >60)
GFR calc non Af Amer: >60 mL/min (ref >60)
Glucose, Bld: 159 mg/dL — ABNORMAL HIGH (ref 70–99)
Potassium: 4.5 mmol/L (ref 3.5–5.1)
Sodium: 139 mmol/L (ref 135–145)

## 2018-04-24 LAB — CBC
HEMATOCRIT: 38.2 % — AB (ref 39.0–52.0)
Hemoglobin: 12 g/dL — ABNORMAL LOW (ref 13.0–17.0)
MCH: 29.5 pg (ref 26.0–34.0)
MCHC: 31.4 g/dL (ref 30.0–36.0)
MCV: 93.9 fL (ref 80.0–100.0)
Platelets: 415 10*3/uL — ABNORMAL HIGH (ref 150–400)
RBC: 4.07 MIL/uL — ABNORMAL LOW (ref 4.22–5.81)
RDW: 13.3 % (ref 11.5–15.5)
WBC: 12.1 10*3/uL — ABNORMAL HIGH (ref 4.0–10.5)
nRBC: 0 % (ref 0.0–0.2)

## 2018-04-24 MED ORDER — PREDNISONE 10 MG PO TABS: 10.0000 mg | ORAL_TABLET | Freq: Every day | ORAL | Status: DC

## 2018-04-24 MED ORDER — FAMOTIDINE 40 MG PO TABS: 40.0000 mg | ORAL_TABLET | Freq: Every day | ORAL | 0 refills | Status: DC

## 2018-04-24 NOTE — Discharge Summary (Signed)
Physician Discharge Summary  Robert Reyes YSA:[id number] DOB: [birth year] DOA: 04/23/2018  PCP: Ladell Pier, MD  Admit date: 04/23/2018 Discharge date: 04/24/2018  Admitted From: Home Disposition:  Home  Recommendations for Outpatient Follow-up:  1. Follow up with PCP in 1-2 weeks 2. Follow up with allergist as scheduled  Discharge Condition:Improved CODE STATUS:Full Diet recommendation: Regular   Brief/Interim Summary: 53 y.o. male with past medical history of angioedema, hypertension being seen at [facility] for angioedema in the past presented to the hospital with complaints of swelling of his tongue.  Patient stated that he had swelling of his tongue when he woke up around 6 AM this morning and has had recurrent episodes of angioedema in the past.  In the past, patient also needed an intubation.  This history of allergy to lidocaine but his angioedema etiology has not been ascertained.  Patient had a muffling of his voice when he came to the hospital with some left sub-lingual swelling.  He received epinephrine in the ED with mild improvement with his voice.  Patient has had multiple airway issues in the past and prior history of multiple intubations including one episode of tracheostomy for the same.  The time of my interview patient had muffled voice but did not have any stridor or wheezing or increased work of breathing.   ED Course: In the ED, patient patient received epinephrine, Solu-Medrol, IV Benadryl, Pepcid.  Patient remained stable for almost 4 hours but still had persistent symptoms so hospitalist team was consulted.  ENT Dr. Wilburn Cornelia was consulted from the ED who recommended observation in stepdown unit and will be available for intubation and airway stabilization if needed.  No further medication orders were recommended by ENT.  Discharge Diagnoses:  Active Problems:   Angioedema   Hypertension  Angioedema with tongue swelling.   -Previous work-up at [facility] with  uncertain etiology.   -Patient has slightly improved with epinephrine, Solu-Medrol Benadryl and Pepcid.   -His condition has remained stable but has persistent angioedema.   -Patient does have history of intubations in the past including tracheostomy.   -Patient much improved by the following morning, tolerated diet without difficulty -On further discussion with family, only new recent food of note was collards which were not store bought -Recommend close follow up with Allergist on discharge  History of hypertension.  Resumed outpatient medication. Remained stable   Discharge Instructions   Allergies as of 04/24/2018      Reactions   Lidocaine Swelling   Lisinopril Swelling, Other (See Comments)   Angioedema with Lisinopril/ HCTZ   Ace Inhibitors Swelling, Other (See Comments)   Angioedema      Medication List    TAKE these medications   acetaminophen 500 MG tablet Commonly known as:  TYLENOL Take 500 mg by mouth every 6 (six) hours as needed for moderate pain or headache.   amLODipine 10 MG tablet Commonly known as:  NORVASC Take 1 tablet (10 mg total) by mouth daily.   cloNIDine 0.2 MG tablet Commonly known as:  CATAPRES Take 1 tablet (0.2 mg total) by mouth 2 (two) times daily.   diphenhydrAMINE 25 mg capsule Commonly known as:  BENADRYL Take 25 mg by mouth daily as needed for allergies.   EPINEPHrine 0.3 mg/0.3 mL Soaj injection Commonly known as:  EPI-PEN Inject 0.3 mg into the muscle as needed for anaphylaxis.   famotidine 40 MG tablet Commonly known as:  PEPCID Take 1 tablet (40 mg total) by mouth daily for 14 days.  predniSONE 10 MG tablet Commonly known as:  DELTASONE Take 1 tablet (10 mg total) by mouth daily. Taper dose: 40mg  po daily x 2 days, then 20mg  po daily x 2 days, then 10mg  po daily x 2 days, then 5mg  po daily x 2 days then stop, zero refills       Allergies  Allergen Reactions  . Lidocaine Swelling  . Lisinopril Swelling and Other  (See Comments)    Angioedema with Lisinopril/ HCTZ  . Ace Inhibitors Swelling and Other (See Comments)    Angioedema    Consultations:  PCCM  Procedures/Studies:  No results found.  Subjective: Eager to go home  Discharge Exam: Vitals:   04/24/18 0414 04/24/18 0822  BP:    Pulse:    Resp:    Temp: 98.2 F (36.8 C) 98.1 F (36.7 C)  SpO2:     Vitals:   04/24/18 0000 04/24/18 0400 04/24/18 0414 04/24/18 0822  BP: 115/78 114/72    Pulse: 71 71    Resp: 14 14    Temp:   98.2 F (36.8 C) 98.1 F (36.7 C)  TempSrc:   Oral Oral  SpO2: 93% 95%    Weight:      Height:        General: Pt is alert, awake, not in acute distress Cardiovascular: RRR, S1/S2 +, no rubs, no gallops Respiratory: CTA bilaterally, no wheezing, no rhonchi Abdominal: Soft, NT, ND, bowel sounds + Extremities: no edema, no cyanosis   The results of significant diagnostics from this hospitalization (including imaging, microbiology, ancillary and laboratory) are listed below for reference.     Microbiology: No results found for this or any previous visit (from the past 240 hour(s)).   Labs: BNP (last 3 results) No results for input(s): BNP in the last 8760 hours. Basic Metabolic Panel: Recent Labs  Lab 04/24/18 0324  NA 139  K 4.5  CL 108  CO2 22  GLUCOSE 159*  BUN 19  CREATININE 1.20  CALCIUM 9.3   Liver Function Tests: No results for input(s): AST, ALT, ALKPHOS, BILITOT, PROT, ALBUMIN in the last 168 hours. No results for input(s): LIPASE, AMYLASE in the last 168 hours. No results for input(s): AMMONIA in the last 168 hours. CBC: Recent Labs  Lab 04/24/18 0324  WBC 12.1*  HGB 12.0*  HCT 38.2*  MCV 93.9  PLT 415*   Cardiac Enzymes: No results for input(s): CKTOTAL, CKMB, CKMBINDEX, TROPONINI in the last 168 hours. BNP: Invalid input(s): POCBNP CBG: No results for input(s): GLUCAP in the last 168 hours. D-Dimer No results for input(s): DDIMER in the last 72  hours. Hgb A1c No results for input(s): HGBA1C in the last 72 hours. Lipid Profile No results for input(s): CHOL, HDL, LDLCALC, TRIG, CHOLHDL, LDLDIRECT in the last 72 hours. Thyroid function studies No results for input(s): TSH, T4TOTAL, T3FREE, THYROIDAB in the last 72 hours.  Invalid input(s): FREET3 Anemia work up No results for input(s): VITAMINB12, FOLATE, FERRITIN, TIBC, IRON, RETICCTPCT in the last 72 hours. Urinalysis    Component Value Date/Time   COLORURINE YELLOW 08/15/2016 2228   APPEARANCEUR CLEAR 08/15/2016 2228   LABSPEC 1.026 08/15/2016 2228   PHURINE 5.0 08/15/2016 2228   GLUCOSEU NEGATIVE 08/15/2016 2228   HGBUR NEGATIVE 08/15/2016 2228   BILIRUBINUR NEGATIVE 08/15/2016 2228   KETONESUR NEGATIVE 08/15/2016 2228   PROTEINUR NEGATIVE 08/15/2016 2228   NITRITE NEGATIVE 08/15/2016 2228   LEUKOCYTESUR LARGE (A) 08/15/2016 2228   Sepsis Labs Invalid input(s): PROCALCITONIN,  WBC,  LACTICIDVEN Microbiology No results found for this or any previous visit (from the past 240 hour(s)).  Time spent: 2min  SIGNED:   Marylu Lund, MD  [facility] 04/24/2018, 10:53 AM  If 7PM-7AM, please contact night-coverage

## 2018-05-25 ENCOUNTER — Emergency Department (HOSPITAL_COMMUNITY)
Admission: EM | Admit: 2018-05-25 | Discharge: 2018-05-25 | Disposition: A | Discharge status: Home / Self Care | Payer: Self-pay | Attending: Emergency Medicine | Admitting: Emergency Medicine

## 2018-05-25 ENCOUNTER — Encounter (HOSPITAL_COMMUNITY): Payer: Self-pay | Admitting: Emergency Medicine

## 2018-05-25 ENCOUNTER — Other Ambulatory Visit: Payer: Self-pay

## 2018-05-25 ENCOUNTER — Emergency Department (HOSPITAL_COMMUNITY): Payer: Self-pay

## 2018-05-25 DIAGNOSIS — N452 Orchitis: Secondary | ICD-10-CM

## 2018-05-25 DIAGNOSIS — Z87891 Personal history of nicotine dependence: Secondary | ICD-10-CM | POA: Insufficient documentation

## 2018-05-25 DIAGNOSIS — I10 Essential (primary) hypertension: Secondary | ICD-10-CM | POA: Insufficient documentation

## 2018-05-25 DIAGNOSIS — N50819 Testicular pain, unspecified: Secondary | ICD-10-CM

## 2018-05-25 DIAGNOSIS — Z79899 Other long term (current) drug therapy: Secondary | ICD-10-CM | POA: Insufficient documentation

## 2018-05-25 DIAGNOSIS — N50812 Left testicular pain: Secondary | ICD-10-CM | POA: Insufficient documentation

## 2018-05-25 LAB — BASIC METABOLIC PANEL
Anion gap: 7 (ref 5–15)
BUN: 14 mg/dL (ref 6–20)
CHLORIDE: 109 mmol/L (ref 98–111)
CO2: 21 mmol/L — ABNORMAL LOW (ref 22–32)
Calcium: 8.8 mg/dL — ABNORMAL LOW (ref 8.9–10.3)
Creatinine, Ser: 1.13 mg/dL (ref 0.61–1.24)
GFR calc Af Amer: >60 mL/min (ref >60)
GFR calc non Af Amer: >60 mL/min (ref >60)
Glucose, Bld: 169 mg/dL — ABNORMAL HIGH (ref 70–99)
Potassium: 3.5 mmol/L (ref 3.5–5.1)
Sodium: 137 mmol/L (ref 135–145)

## 2018-05-25 LAB — CBC WITH DIFFERENTIAL/PLATELET
Abs Immature Granulocytes: 0.19 10*3/uL — ABNORMAL HIGH (ref 0.00–0.07)
BASOS PCT: 0 %
Basophils Absolute: 0.1 10*3/uL (ref 0.0–0.1)
Eosinophils Absolute: 0 10*3/uL (ref 0.0–0.5)
Eosinophils Relative: 0 %
HCT: 36.7 % — ABNORMAL LOW (ref 39.0–52.0)
Hemoglobin: 11.7 g/dL — ABNORMAL LOW (ref 13.0–17.0)
Immature Granulocytes: 1 %
Lymphocytes Relative: 7 %
Lymphs Abs: 1.4 10*3/uL (ref 0.7–4.0)
MCH: 29.6 pg (ref 26.0–34.0)
MCHC: 31.9 g/dL (ref 30.0–36.0)
MCV: 92.9 fL (ref 80.0–100.0)
MONOS PCT: 5 %
Monocytes Absolute: 0.9 10*3/uL (ref 0.1–1.0)
Neutro Abs: 17.3 10*3/uL — ABNORMAL HIGH (ref 1.7–7.7)
Neutrophils Relative %: 87 %
Platelets: 247 10*3/uL (ref 150–400)
RBC: 3.95 MIL/uL — ABNORMAL LOW (ref 4.22–5.81)
RDW: 14.6 % (ref 11.5–15.5)
WBC: 19.9 10*3/uL — ABNORMAL HIGH (ref 4.0–10.5)
nRBC: 0 % (ref 0.0–0.2)

## 2018-05-25 LAB — URINALYSIS, ROUTINE W REFLEX MICROSCOPIC
BILIRUBIN URINE: NEGATIVE
Glucose, UA: NEGATIVE mg/dL
Ketones, ur: 20 mg/dL — AB
Nitrite: NEGATIVE
Protein, ur: 30 mg/dL — AB
SPECIFIC GRAVITY, URINE: 1.025 (ref 1.005–1.030)
WBC, UA: >50 WBC/hpf — ABNORMAL HIGH (ref 0–5)
pH: 5 (ref 5.0–8.0)

## 2018-05-25 MED ORDER — ACETAMINOPHEN 325 MG PO TABS
650.0000 mg | ORAL_TABLET | Freq: Once | ORAL | Status: AC
  Administered 2018-05-25: 650 mg via ORAL
  Filled 2018-05-25: qty 2

## 2018-05-25 MED ORDER — SODIUM CHLORIDE 0.9 % IV BOLUS (SEPSIS)
1000.0000 mL | Freq: Once | INTRAVENOUS | Status: AC
  Administered 2018-05-25: 1000 mL via INTRAVENOUS

## 2018-05-25 MED ORDER — FENTANYL CITRATE (PF) 100 MCG/2ML IJ SOLN
100.0000 ug | Freq: Once | INTRAMUSCULAR | Status: AC
  Administered 2018-05-25: 100 ug via INTRAVENOUS
  Filled 2018-05-25: qty 2

## 2018-05-25 MED ORDER — HYDROCODONE-ACETAMINOPHEN 5-325 MG PO TABS: 1.0000 | ORAL_TABLET | Freq: Four times a day (QID) | ORAL | 0 refills | Status: DC | PRN

## 2018-05-25 MED ORDER — LEVOFLOXACIN 500 MG PO TABS: 500.0000 mg | ORAL_TABLET | Freq: Every day | ORAL | 0 refills | Status: DC

## 2018-05-25 MED ORDER — LEVOFLOXACIN IN D5W 750 MG/150ML IV SOLN
750.0000 mg | Freq: Once | INTRAVENOUS | Status: AC
  Administered 2018-05-25: 750 mg via INTRAVENOUS
  Filled 2018-05-25: qty 150

## 2018-05-25 MED FILL — HYDROCODON-APAP 5-325: 5-325 | 1 days supply | Qty: 8 | Fill #0

## 2018-05-25 MED FILL — levoFLOXacin 500 MG TABS: 500 | 10 days supply | Qty: 10 | Fill #0

## 2018-05-25 NOTE — ED Provider Notes (Signed)
[facility] DEPT Provider Note   CSN: [id number] Arrival date & time: 05/25/18  0509    History   Chief Complaint Chief Complaint  Patient presents with  . Testicle Pain  . Fever    HPI Taishawn Smaldone is a 53 y.o. male.     The history is provided by the patient and a significant other.  Testicle Pain  This is a new problem. The current episode started 2 days ago. The problem occurs constantly. The problem has been gradually worsening. Pertinent negatives include no chest pain, no abdominal pain and no headaches. Exacerbated by: palpation. The symptoms are relieved by rest. He has tried nothing for the symptoms.  Fever  Severity:  Moderate Onset quality:  Sudden Timing:  Constant Progression:  Worsening Chronicity:  New Relieved by:  None tried Worsened by:  Nothing Associated symptoms: chills   Associated symptoms: no chest pain, no cough, no dysuria, no headaches and no sore throat   Patient presents for testicle pain and fever.  He reports he has been having testicle pain for up to 2 days.  He woke up with fevers and chills.  He reports the testicle pain is worsening.  No vomiting or diarrhea.  No dysuria.  No abdominal pain He does report some urinary frequency and nocturia at night, but unknown if he has any prostate issues. He reports he had penile surgery several years ago for a "spider bite" He has never had any scrotal surgery. No recent scrotal trauma. Past Medical History:  Diagnosis Date  . Angioedema   . Hypertension     Patient Active Problem List   Diagnosis Date Noted  . Allergic reaction 10/12/2016  . Tobacco dependence 08/20/2016  . Hypertension 07/22/2016  . Angioedema 07/02/2016    Past Surgical History:  Procedure Laterality Date  . surgery on genitals    . tracheosotmy          Home Medications    Prior to Admission medications   Medication Sig Start Date End Date Taking? Authorizing Provider    acetaminophen (TYLENOL) 500 MG tablet Take 500 mg by mouth every 6 (six) hours as needed for moderate pain or headache.   Yes [provider]  amLODipine (NORVASC) 10 MG tablet Take 1 tablet (10 mg total) by mouth daily. 01/05/18  Yes Ladell Pier, MD  cloNIDine (CATAPRES) 0.2 MG tablet Take 1 tablet (0.2 mg total) by mouth 2 (two) times daily. 01/05/18  Yes Ladell Pier, MD  diphenhydrAMINE (BENADRYL) 25 mg capsule Take 25 mg by mouth daily as needed for allergies.    Yes [provider]  EPINEPHrine 0.3 mg/0.3 mL IJ SOAJ injection Inject 0.3 mg into the muscle as needed for anaphylaxis.  09/10/17  Yes [provider]  famotidine (PEPCID) 40 MG tablet Take 1 tablet (40 mg total) by mouth daily for 14 days. Patient not taking: Reported on 05/25/2018 04/24/18 05/08/18  Donne Hazel, MD  predniSONE (DELTASONE) 10 MG tablet Take 1 tablet (10 mg total) by mouth daily. Taper dose: 40mg  po daily x 2 days, then 20mg  po daily x 2 days, then 10mg  po daily x 2 days, then 5mg  po daily x 2 days then stop, zero refills Patient not taking: Reported on 05/25/2018 04/24/18   Donne Hazel, MD    Family History Family History  Problem Relation Age of Onset  . Urticaria Mother   . Angioedema Brother   . Asthma Neg Hx   .  Allergic rhinitis Neg Hx   . Eczema Neg Hx   . Immunodeficiency Neg Hx     Social History Social History   Tobacco Use  . Smoking status: Former Smoker    Types: Cigarettes  . Smokeless tobacco: Former Network engineer Use Topics  . Alcohol use: Not Currently    Comment: occ  . Drug use: Not Currently    Types: Marijuana    Comment: stopped 2-3 mths ago  05/2016     Allergies   Lidocaine; Lisinopril; and Ace inhibitors   Review of Systems Review of Systems  Constitutional: Positive for chills and fever.  HENT: Negative for sore throat.   Respiratory: Negative for cough.   Cardiovascular: Negative for chest pain.  Gastrointestinal:  Negative for abdominal pain.  Genitourinary: Positive for testicular pain. Negative for discharge and dysuria.  Musculoskeletal: Negative for neck stiffness.  Neurological: Negative for headaches.  All other systems reviewed and are negative.    Physical Exam Updated Vital Signs BP (!) 157/93 (BP Location: Left Arm)   Pulse (!) 103   Temp (!) 100.4 F (38 C) (Oral)   Resp 16   SpO2 97%   Physical Exam CONSTITUTIONAL: Well developed/well nourished HEAD: Normocephalic/atraumatic EYES: EOMI/PERRL ENMT: Mucous membranes moist NECK: supple no meningeal signs SPINE/BACK:entire spine nontender CV: S1/S2 noted, no murmurs/rubs/gallops noted LUNGS: Lungs are clear to auscultation bilaterally, no apparent distress ABDOMEN: soft, nontender, no rebound or guarding, bowel sounds noted throughout abdomen GU:no cva tenderness, patient is uncircumcised.  There is no penile discharge, no penile lesions.  There is significant left testicular tenderness and enlargement.  No obvious hernia noted. Rectal-no mass noted, no prostate enlargement. Chaperone present for exam NEURO: Pt is awake/alert/appropriate, moves all extremitiesx4.  No facial droop.   EXTREMITIES: pulses normal/equal, full ROM SKIN: warm, color normal PSYCH: no abnormalities of mood noted, alert and oriented to situation   ED Treatments / Results  Labs (all labs ordered are listed, but only abnormal results are displayed) Labs Reviewed  CBC WITH DIFFERENTIAL/PLATELET - Abnormal; Notable for the following components:      Result Value   WBC 19.9 (*)    RBC 3.95 (*)    Hemoglobin 11.7 (*)    HCT 36.7 (*)    All other components within normal limits  URINE CULTURE  URINALYSIS, ROUTINE W REFLEX MICROSCOPIC  BASIC METABOLIC PANEL  GC/CHLAMYDIA PROBE AMP ([place]) NOT AT [facility]    EKG None  Radiology No results found.  Procedures Procedures  Medications Ordered in ED Medications  levofloxacin (LEVAQUIN) IVPB 750  mg (750 mg Intravenous New Bag/Given 05/25/18 0618)  acetaminophen (TYLENOL) tablet 650 mg (650 mg Oral Given 05/25/18 0558)  sodium chloride 0.9 % bolus 1,000 mL (1,000 mLs Intravenous New Bag/Given 05/25/18 6503)  fentaNYL (SUBLIMAZE) injection 100 mcg (100 mcg Intravenous Given 05/25/18 5465)     Initial Impression / Assessment and Plan / ED Course  I have reviewed the triage vital signs and the nursing notes.  Pertinent labs   results that were available during my care of the patient were reviewed by me and considered in my medical decision making (see chart for details).        6:08 AM Patient presents for testicle pain for 2 days now with acute fever and worsening pain.  Strong suspicion for epididymitis/orchitis Labs and ultrasound imaging are pending. 6:59 AM Pt with some improvement in pain US imaging pending Signed out to dr. Alvino Chapel with US imaging pending  He has already been given levaquin for presumed orchitis  Final Clinical Impressions(s) / ED Diagnoses   Final diagnoses:  Testicle pain    ED Discharge Orders    None       Ripley Fraise, MD 05/25/18 0700

## 2018-05-25 NOTE — ED Notes (Signed)
Patient given discharge teaching and verbalized understanding. Patient ambulated out of ED with a steady gait. 

## 2018-05-25 NOTE — ED Triage Notes (Signed)
Patient presents with sudden onset of left testicle swelling and fever this morning. Patient states it has been bothering him for a few days but not so swollen. Patient denies urinary symptoms.

## 2018-05-26 LAB — URINE CULTURE: CULTURE: NO GROWTH

## 2018-05-27 ENCOUNTER — Emergency Department (HOSPITAL_COMMUNITY)
Admission: EM | Admit: 2018-05-27 | Discharge: 2018-05-27 | Disposition: A | Discharge status: Home / Self Care | Payer: Self-pay | Attending: Emergency Medicine | Admitting: Emergency Medicine

## 2018-05-27 ENCOUNTER — Encounter (HOSPITAL_COMMUNITY): Payer: Self-pay | Admitting: Emergency Medicine

## 2018-05-27 ENCOUNTER — Emergency Department (HOSPITAL_COMMUNITY): Payer: Self-pay

## 2018-05-27 DIAGNOSIS — Z79899 Other long term (current) drug therapy: Secondary | ICD-10-CM | POA: Insufficient documentation

## 2018-05-27 DIAGNOSIS — I10 Essential (primary) hypertension: Secondary | ICD-10-CM | POA: Insufficient documentation

## 2018-05-27 DIAGNOSIS — Z87891 Personal history of nicotine dependence: Secondary | ICD-10-CM | POA: Insufficient documentation

## 2018-05-27 DIAGNOSIS — N451 Epididymitis: Secondary | ICD-10-CM | POA: Insufficient documentation

## 2018-05-27 LAB — URINALYSIS, ROUTINE W REFLEX MICROSCOPIC
Bilirubin Urine: NEGATIVE
GLUCOSE, UA: NEGATIVE mg/dL
Hgb urine dipstick: NEGATIVE
Ketones, ur: NEGATIVE mg/dL
Nitrite: NEGATIVE
Protein, ur: 30 mg/dL — AB
Specific Gravity, Urine: 1.02 (ref 1.005–1.030)
WBC, UA: >50 WBC/hpf — ABNORMAL HIGH (ref 0–5)
pH: 6 (ref 5.0–8.0)

## 2018-05-27 MED ORDER — HYDROCODONE-ACETAMINOPHEN 5-325 MG PO TABS: 1.0000 | ORAL_TABLET | Freq: Four times a day (QID) | ORAL | 0 refills | Status: DC | PRN

## 2018-05-27 MED ORDER — SODIUM CHLORIDE 0.9 % IV BOLUS
500.0000 mL | Freq: Once | INTRAVENOUS | Status: AC
  Administered 2018-05-27: 500 mL via INTRAVENOUS

## 2018-05-27 MED ORDER — ONDANSETRON HCL 4 MG/2ML IJ SOLN
4.0000 mg | Freq: Once | INTRAMUSCULAR | Status: AC
  Administered 2018-05-27: 4 mg via INTRAVENOUS
  Filled 2018-05-27: qty 2

## 2018-05-27 MED ORDER — FENTANYL CITRATE (PF) 100 MCG/2ML IJ SOLN
50.0000 ug | Freq: Once | INTRAMUSCULAR | Status: AC
  Administered 2018-05-27: 50 ug via INTRAVENOUS
  Filled 2018-05-27: qty 2

## 2018-05-27 MED ORDER — KETOROLAC TROMETHAMINE 30 MG/ML IJ SOLN
15.0000 mg | Freq: Once | INTRAMUSCULAR | Status: AC
  Administered 2018-05-27: 15 mg via INTRAVENOUS
  Filled 2018-05-27: qty 1

## 2018-05-27 NOTE — ED Provider Notes (Signed)
[facility] DEPT Provider Note   CSN: [id number] Arrival date & time: 05/27/18  5188    History   Chief Complaint Chief Complaint  Patient presents with  . Testicle Pain    HPI Robert Reyes is a 53 y.o. male.     HPI Patient presents with concern of worsening testicle pain. Notably, the patient was seen here 3 days ago, diagnosed with orchitis, discharged with antibiotics, analgesia. He notes that since that evaluation he has had worsening pain and swelling in the left testicle. No obvious new dysuria, and he acknowledges he that he is able to urinate. No fever, since initiation of antibiotics. However, with worsening pain, swelling he presents for evaluation. He is accompanied by male companion who assists with the HPI. Past Medical History:  Diagnosis Date  . Angioedema   . Hypertension     Patient Active Problem List   Diagnosis Date Noted  . Allergic reaction 10/12/2016  . Tobacco dependence 08/20/2016  . Hypertension 07/22/2016  . Angioedema 07/02/2016    Past Surgical History:  Procedure Laterality Date  . surgery on genitals    . tracheosotmy          Home Medications    Prior to Admission medications   Medication Sig Start Date End Date Taking? Authorizing Provider  acetaminophen (TYLENOL) 325 MG tablet Take 325 mg by mouth every 6 (six) hours as needed for mild pain.   Yes [provider]  amLODipine (NORVASC) 10 MG tablet Take 1 tablet (10 mg total) by mouth daily. 01/05/18  Yes Ladell Pier, MD  cloNIDine (CATAPRES) 0.2 MG tablet Take 1 tablet (0.2 mg total) by mouth 2 (two) times daily. 01/05/18  Yes Ladell Pier, MD  EPINEPHrine 0.3 mg/0.3 mL IJ SOAJ injection Inject 0.3 mg into the muscle as needed for anaphylaxis.  09/10/17  Yes [provider]  HYDROcodone-acetaminophen (NORCO/VICODIN) 5-325 MG tablet Take 1-2 tablets by mouth every 6 (six) hours as needed. 05/25/18  Yes  Davonna Belling, MD  levofloxacin (LEVAQUIN) 500 MG tablet Take 1 tablet (500 mg total) by mouth daily. 05/25/18  Yes Davonna Belling, MD  famotidine (PEPCID) 40 MG tablet Take 1 tablet (40 mg total) by mouth daily for 14 days. Patient not taking: Reported on 05/25/2018 04/24/18 05/08/18  Donne Hazel, MD  predniSONE (DELTASONE) 10 MG tablet Take 1 tablet (10 mg total) by mouth daily. Taper dose: 40mg  po daily x 2 days, then 20mg  po daily x 2 days, then 10mg  po daily x 2 days, then 5mg  po daily x 2 days then stop, zero refills Patient not taking: Reported on 05/25/2018 04/24/18   Donne Hazel, MD    Family History Family History  Problem Relation Age of Onset  . Urticaria Mother   . Angioedema Brother   . Asthma Neg Hx   . Allergic rhinitis Neg Hx   . Eczema Neg Hx   . Immunodeficiency Neg Hx     Social History Social History   Tobacco Use  . Smoking status: Former Smoker    Types: Cigarettes  . Smokeless tobacco: Former Network engineer Use Topics  . Alcohol use: Not Currently    Comment: occ  . Drug use: Not Currently    Types: Marijuana    Comment: stopped 2-3 mths ago  05/2016     Allergies   Lidocaine; Lisinopril; and Ace inhibitors   Review of Systems Review of Systems  Constitutional:  Per HPI, otherwise negative  HENT:       Per HPI, otherwise negative  Respiratory:       Per HPI, otherwise negative  Cardiovascular:       Per HPI, otherwise negative  Gastrointestinal: Negative for vomiting.  Endocrine:       Negative aside from HPI  Genitourinary:       Neg aside from HPI   Musculoskeletal:       Per HPI, otherwise negative  Skin: Negative.   Neurological: Negative for syncope.     Physical Exam Updated Vital Signs BP (!) 137/101 (BP Location: Left Arm)   Pulse 98   Temp 98.5 F (36.9 C) (Oral)   SpO2 100%   Physical Exam Vitals signs and nursing note reviewed.  Constitutional:      General: He is not in acute distress.     Appearance: He is well-developed.  HENT:     Head: Normocephalic and atraumatic.  Eyes:     Conjunctiva/sclera: Conjunctivae normal.  Cardiovascular:     Rate and Rhythm: Normal rate and regular rhythm.  Pulmonary:     Effort: Pulmonary effort is normal. No respiratory distress.     Breath sounds: No stridor.  Abdominal:     General: There is no distension.  Genitourinary:   Skin:    General: Skin is warm and dry.  Neurological:     Mental Status: He is alert and oriented to person, place, and time.      ED Treatments / Results  Labs (all labs ordered are listed, but only abnormal results are displayed) Labs Reviewed  URINALYSIS, ROUTINE W REFLEX MICROSCOPIC - Abnormal; Notable for the following components:      Result Value   Protein, ur 30 (*)    Leukocytes,Ua LARGE (*)    WBC, UA >50 (*)    Bacteria, UA RARE (*)    All other components within normal limits    EKG None  Radiology US Scrotum W/doppler  Result Date: 05/27/2018 CLINICAL DATA:  Worsening left testicular swelling and pain. EXAM: SCROTAL ULTRASOUND DOPPLER ULTRASOUND OF THE TESTICLES TECHNIQUE: Complete ultrasound examination of the testicles, epididymis, and other scrotal structures was performed. Color and spectral Doppler ultrasound were also utilized to evaluate blood flow to the testicles. COMPARISON:  Scrotal ultrasound dated May 25, 2018. FINDINGS: Right testicle Measurements: 3.8 x 2.6 x 2.2 cm. No mass or microlithiasis visualized. Heterogeneous echotexture without hyperemia. Left testicle Measurements: 5.4 x 3.7 x 4.3 cm. No mass or microlithiasis visualized. Heterogeneous echotexture with hyperemia Right epididymis:  Normal in size and appearance. Left epididymis: Mildly enlarged and hyperemic, unchanged. 9 mm epididymal cyst again noted. Hydrocele:  Unchanged small left and trace right hydroceles. Varicocele:  Unchanged left varicocele. Pulsed Doppler interrogation of both testes demonstrates  normal low resistance arterial and venous waveforms bilaterally. IMPRESSION: 1. Persistent left epididymo-orchitis. 2. Unchanged small left and trace right hydroceles. 3. Unchanged left varicocele. Electronically Signed   By: Titus Dubin M.D.   On: 05/27/2018 11:19    Procedures Procedures (including critical care time)  Medications Ordered in ED Medications  sodium chloride 0.9 % bolus 500 mL (0 mLs Intravenous Stopped 05/27/18 1150)  ondansetron (ZOFRAN) injection 4 mg (4 mg Intravenous Given 05/27/18 1038)  ketorolac (TORADOL) 30 MG/ML injection 15 mg (15 mg Intravenous Given 05/27/18 1038)  fentaNYL (SUBLIMAZE) injection 50 mcg (50 mcg Intravenous Given 05/27/18 1038)     Initial Impression / Assessment and Plan / ED Course  I have reviewed the triage vital signs and the nursing notes.  Pertinent labs & imaging results that were available during my care of the patient were reviewed by me and considered in my medical decision making (see chart for details).    Chart review after the initial evaluation notable for ultrasound findings concerning for possible orchitis versus epididymitis, with concern for worsening infection, possible abscess formation, ultrasound will be repeated.    12:53 PM On repeat exam the patient is awake and alert, notes that the pain medication has reduced his discomfort. Labs reviewed, ultrasound reviewed with him and his wife. No evidence for progression to abscess. Urinalysis continues to demonstrate evidence for infection. Patient is afebrile, awake, alert, and with better symptom management, is appropriate for discharge with close outpatient follow-up with urology, which he notes he already has scheduled for this week. Patient will receive additional pain medication on discharge.  Final Clinical Impressions(s) / ED Diagnoses  epididymitis   Carmin Muskrat, MD 05/27/18 1254

## 2018-05-27 NOTE — ED Triage Notes (Signed)
Patient here from home with complaints of left testicle swelling x3 days. Reports being seen for same with no relief. Given pain medication and antibiotics states "nothing is helping".

## 2018-05-27 NOTE — ED Notes (Signed)
ED Provider at bedside. 

## 2018-05-27 NOTE — Discharge Instructions (Signed)
As discussed, your evaluation today has been largely reassuring.  But, it is important that you monitor your condition carefully, and do not hesitate to return to the ED if you develop new, or concerning changes in your condition. ? ?Otherwise, please follow-up with your physician for appropriate ongoing care. ? ?

## 2018-05-27 NOTE — ED Notes (Signed)
Ultrasound at bedside

## 2018-05-30 ENCOUNTER — Encounter (HOSPITAL_COMMUNITY): Payer: Self-pay | Admitting: Emergency Medicine

## 2018-05-30 ENCOUNTER — Observation Stay (HOSPITAL_COMMUNITY)
Admission: EM | Admit: 2018-05-30 | Discharge: 2018-05-31 | Disposition: A | Discharge status: Home / Self Care | Payer: Self-pay | Attending: Nephrology | Admitting: Nephrology

## 2018-05-30 ENCOUNTER — Other Ambulatory Visit: Payer: Self-pay

## 2018-05-30 DIAGNOSIS — Z888 Allergy status to other drugs, medicaments and biological substances status: Secondary | ICD-10-CM | POA: Insufficient documentation

## 2018-05-30 DIAGNOSIS — F17210 Nicotine dependence, cigarettes, uncomplicated: Secondary | ICD-10-CM | POA: Insufficient documentation

## 2018-05-30 DIAGNOSIS — N452 Orchitis: Secondary | ICD-10-CM | POA: Insufficient documentation

## 2018-05-30 DIAGNOSIS — E860 Dehydration: Secondary | ICD-10-CM

## 2018-05-30 DIAGNOSIS — X58XXXA Exposure to other specified factors, initial encounter: Secondary | ICD-10-CM | POA: Insufficient documentation

## 2018-05-30 DIAGNOSIS — T781XXA Other adverse food reactions, not elsewhere classified, initial encounter: Secondary | ICD-10-CM | POA: Insufficient documentation

## 2018-05-30 DIAGNOSIS — Z79899 Other long term (current) drug therapy: Secondary | ICD-10-CM | POA: Insufficient documentation

## 2018-05-30 DIAGNOSIS — I10 Essential (primary) hypertension: Secondary | ICD-10-CM

## 2018-05-30 DIAGNOSIS — T783XXA Angioneurotic edema, initial encounter: Principal | ICD-10-CM

## 2018-05-30 DIAGNOSIS — Z791 Long term (current) use of non-steroidal anti-inflammatories (NSAID): Secondary | ICD-10-CM | POA: Insufficient documentation

## 2018-05-30 LAB — BASIC METABOLIC PANEL
ANION GAP: 9 (ref 5–15)
BUN: 17 mg/dL (ref 6–20)
CALCIUM: 9.1 mg/dL (ref 8.9–10.3)
CO2: 25 mmol/L (ref 22–32)
Chloride: 105 mmol/L (ref 98–111)
Creatinine, Ser: 1.47 mg/dL — ABNORMAL HIGH (ref 0.61–1.24)
GFR calc non Af Amer: 55 mL/min — ABNORMAL LOW (ref >60)
Glucose, Bld: 137 mg/dL — ABNORMAL HIGH (ref 70–99)
Potassium: 4.1 mmol/L (ref 3.5–5.1)
Sodium: 139 mmol/L (ref 135–145)

## 2018-05-30 LAB — CBC WITH DIFFERENTIAL/PLATELET
Abs Immature Granulocytes: 0.18 10*3/uL — ABNORMAL HIGH (ref 0.00–0.07)
BASOS ABS: 0.1 10*3/uL (ref 0.0–0.1)
Basophils Relative: 1 %
Eosinophils Absolute: 0.1 10*3/uL (ref 0.0–0.5)
Eosinophils Relative: 2 %
HCT: 40.3 % (ref 39.0–52.0)
Hemoglobin: 12.3 g/dL — ABNORMAL LOW (ref 13.0–17.0)
IMMATURE GRANULOCYTES: 2 %
Lymphocytes Relative: 28 %
Lymphs Abs: 2.6 10*3/uL (ref 0.7–4.0)
MCH: 28.7 pg (ref 26.0–34.0)
MCHC: 30.5 g/dL (ref 30.0–36.0)
MCV: 94.2 fL (ref 80.0–100.0)
Monocytes Absolute: 0.6 10*3/uL (ref 0.1–1.0)
Monocytes Relative: 6 %
NEUTROS PCT: 61 %
Neutro Abs: 5.6 10*3/uL (ref 1.7–7.7)
Platelets: 358 10*3/uL (ref 150–400)
RBC: 4.28 MIL/uL (ref 4.22–5.81)
RDW: 14.3 % (ref 11.5–15.5)
WBC: 9.1 10*3/uL (ref 4.0–10.5)
nRBC: 0 % (ref 0.0–0.2)

## 2018-05-30 LAB — TYPE AND SCREEN
ABO/RH(D): B POS
Antibody Screen: NEGATIVE

## 2018-05-30 LAB — MRSA PCR SCREENING: MRSA by PCR: NEGATIVE

## 2018-05-30 MED ORDER — DIPHENHYDRAMINE HCL 50 MG/ML IJ SOLN
25.0000 mg | Freq: Four times a day (QID) | INTRAMUSCULAR | Status: AC
  Administered 2018-05-30 – 2018-05-31 (×2): 25 mg via INTRAVENOUS
  Filled 2018-05-30 (×2): qty 1

## 2018-05-30 MED ORDER — EPINEPHRINE 0.3 MG/0.3ML IJ SOAJ
INTRAMUSCULAR | Status: AC
  Administered 2018-05-30: 0.3 mg
  Filled 2018-05-30: qty 0.3

## 2018-05-30 MED ORDER — CLONIDINE HCL 0.1 MG PO TABS
0.2000 mg | ORAL_TABLET | Freq: Two times a day (BID) | ORAL | Status: DC
  Administered 2018-05-30 – 2018-05-31 (×2): 0.2 mg via ORAL
  Filled 2018-05-30 (×2): qty 2

## 2018-05-30 MED ORDER — ACETAMINOPHEN 650 MG RE SUPP: 650.0000 mg | Freq: Four times a day (QID) | RECTAL | Status: DC | PRN

## 2018-05-30 MED ORDER — SODIUM CHLORIDE 0.9 % IV SOLN
INTRAVENOUS | Status: DC
  Administered 2018-05-30: 75 mL/h via INTRAVENOUS

## 2018-05-30 MED ORDER — METHYLPREDNISOLONE SODIUM SUCC 125 MG IJ SOLR
125.0000 mg | Freq: Once | INTRAMUSCULAR | Status: AC
  Administered 2018-05-30: 125 mg via INTRAVENOUS
  Filled 2018-05-30: qty 2

## 2018-05-30 MED ORDER — AMLODIPINE BESYLATE 5 MG PO TABS
10.0000 mg | ORAL_TABLET | Freq: Every day | ORAL | Status: DC
  Administered 2018-05-31: 10 mg via ORAL
  Filled 2018-05-30: qty 2

## 2018-05-30 MED ORDER — DIPHENHYDRAMINE HCL 50 MG/ML IJ SOLN
50.0000 mg | Freq: Once | INTRAMUSCULAR | Status: AC
  Administered 2018-05-30: 50 mg via INTRAVENOUS
  Filled 2018-05-30: qty 1

## 2018-05-30 MED ORDER — SODIUM CHLORIDE 0.9% FLUSH: 3.0000 mL | Freq: Two times a day (BID) | INTRAVENOUS | Status: DC

## 2018-05-30 MED ORDER — ACETAMINOPHEN 325 MG PO TABS: 650.0000 mg | ORAL_TABLET | Freq: Four times a day (QID) | ORAL | Status: DC | PRN

## 2018-05-30 MED ORDER — ONDANSETRON HCL 4 MG PO TABS: 4.0000 mg | ORAL_TABLET | Freq: Four times a day (QID) | ORAL | Status: DC | PRN

## 2018-05-30 MED ORDER — LEVOFLOXACIN 500 MG PO TABS
500.0000 mg | ORAL_TABLET | Freq: Every day | ORAL | Status: DC
  Administered 2018-05-31: 500 mg via ORAL
  Filled 2018-05-30: qty 1

## 2018-05-30 MED ORDER — FAMOTIDINE IN NACL 20-0.9 MG/50ML-% IV SOLN
20.0000 mg | Freq: Two times a day (BID) | INTRAVENOUS | Status: DC
  Administered 2018-05-30: 20 mg via INTRAVENOUS
  Filled 2018-05-30: qty 50

## 2018-05-30 MED ORDER — METHYLPREDNISOLONE SODIUM SUCC 40 MG IJ SOLR
40.0000 mg | Freq: Four times a day (QID) | INTRAMUSCULAR | Status: DC
  Administered 2018-05-30 – 2018-05-31 (×2): 40 mg via INTRAVENOUS
  Filled 2018-05-30 (×3): qty 1

## 2018-05-30 MED ORDER — FAMOTIDINE IN NACL 20-0.9 MG/50ML-% IV SOLN
20.0000 mg | Freq: Once | INTRAVENOUS | Status: AC
  Administered 2018-05-30: 20 mg via INTRAVENOUS
  Filled 2018-05-30: qty 50

## 2018-05-30 MED ORDER — ONDANSETRON HCL 4 MG/2ML IJ SOLN: 4.0000 mg | Freq: Four times a day (QID) | INTRAMUSCULAR | Status: DC | PRN

## 2018-05-30 MED ORDER — SODIUM CHLORIDE 0.9 % IV SOLN
10.0000 mL/h | Freq: Once | INTRAVENOUS | Status: AC
  Administered 2018-05-30: 10 mL/h via INTRAVENOUS

## 2018-05-30 NOTE — ED Notes (Signed)
ED TO INPATIENT HANDOFF REPORT  Name/Age/Gender Robert Reyes 53 y.o. male  Code Status Code Status History    Date Active Date Inactive Code Status Order ID Comments User Context   04/23/2018 1312 04/24/2018 1417 Full Code [id number]  Flora Lipps, MD ED   09/07/2017 1509 09/10/2017 1735 Full Code [id number]  Kipp Brood, MD ED   08/24/2016 2310 08/26/2016 1409 Full Code [id number]  Norval Morton, MD ED   07/02/2016 1007 07/05/2016 2035 Full Code [id number]  Raylene Miyamoto, MD ED      Home/SNF/Other Home  Chief Complaint tongue swelling  Level of Care/Admitting Diagnosis ED Disposition    ED Disposition Condition [facility] Area: [facility] [[id number]]  Level of Care: Stepdown [14]  Admit to SDU based on following criteria: Respiratory Distress:  Frequent assessment and/or intervention to maintain adequate ventilation/respiration, pulmonary toilet, and respiratory treatment.  Diagnosis: Angioedema [703500]  Admitting Physician: Samuella Cota [[id number]]  Attending Physician: Samuella Cota [[id number]]  PT Class (Do Not Modify): Observation [104]  PT Acc Code (Do Not Modify): Observation [10022]       Medical History Past Medical History:  Diagnosis Date  . Angioedema   . Hypertension     Allergies Allergies  Allergen Reactions  . Lidocaine Swelling  . Lisinopril Swelling and Other (See Comments)    Angioedema with Lisinopril/ HCTZ  . Ace Inhibitors Swelling and Other (See Comments)    Angioedema  . Blueberry Flavor     Blueberries angioedema    IV Location/Drains/Wounds Patient Lines/Drains/Airways Status   Active Line/Drains/Airways    Name:   Placement date:   Placement time:   Site:   Days:   Peripheral IV 05/30/18 Right Antecubital   05/30/18    1519    Antecubital   less than 1          Labs/Imaging Results for orders placed or performed during the hospital encounter of 05/30/18 (from the past 48 hour(s))   CBC with Differential     Status: Abnormal   Collection Time: 05/30/18  3:08 PM  Result Value Ref Range   WBC 9.1 4.0 - 10.5 K/uL   RBC 4.28 4.22 - 5.81 MIL/uL   Hemoglobin 12.3 (L) 13.0 - 17.0 g/dL   HCT 40.3 39.0 - 52.0 %   MCV 94.2 80.0 - 100.0 fL   MCH 28.7 26.0 - 34.0 pg   MCHC 30.5 30.0 - 36.0 g/dL   RDW 14.3 11.5 - 15.5 %   Platelets 358 150 - 400 K/uL   nRBC 0.0 0.0 - 0.2 %   Neutrophils Relative % 61 %   Neutro Abs 5.6 1.7 - 7.7 K/uL   Lymphocytes Relative 28 %   Lymphs Abs 2.6 0.7 - 4.0 K/uL   Monocytes Relative 6 %   Monocytes Absolute 0.6 0.1 - 1.0 K/uL   Eosinophils Relative 2 %   Eosinophils Absolute 0.1 0.0 - 0.5 K/uL   Basophils Relative 1 %   Basophils Absolute 0.1 0.0 - 0.1 K/uL   Immature Granulocytes 2 %   Abs Immature Granulocytes 0.18 (H) 0.00 - 0.07 K/uL    Comment: Performed at [facility], [place] [address], [place], [place] [address]  Basic metabolic panel     Status: Abnormal   Collection Time: 05/30/18  3:08 PM  Result Value Ref Range   Sodium 139 135 - 145 mmol/L   Potassium 4.1 3.5 - 5.1 mmol/L  Chloride 105 98 - 111 mmol/L   CO2 25 22 - 32 mmol/L   Glucose, Bld 137 (H) 70 - 99 mg/dL   BUN 17 6 - 20 mg/dL   Creatinine, Ser 1.47 (H) 0.61 - 1.24 mg/dL   Calcium 9.1 8.9 - 10.3 mg/dL   GFR calc non Af Amer 55 (L) >60 mL/min   GFR calc Af Amer >60 >60 mL/min   Anion gap 9 5 - 15    Comment: Performed at [facility], [place] [address], [place], [place] [address]  Prepare fresh frozen plasma     Status: None (Preliminary result)   Collection Time: 05/30/18  3:20 PM  Result Value Ref Range   Unit Number [id number]    Blood Component Type THAWED PLASMA    Unit division 00    Status of Unit ISSUED    Transfusion Status      OK TO TRANSFUSE Performed at [place] [address], [place], [place] [address]    Unit Number [id number]    Blood Component Type THAWED PLASMA    Unit  division 00    Status of Unit ALLOCATED    Transfusion Status OK TO TRANSFUSE   Type and screen [place]     Status: None   Collection Time: 05/30/18  3:20 PM  Result Value Ref Range   ABO/RH(D) B POS    Antibody Screen NEG    Sample Expiration      06/02/2018 Performed at [facility], [place] [address] [place], [place] [address]    No results found. None  Pending Labs Unresulted Labs (From admission, onward)   None      Vitals/Pain Today's Vitals   05/30/18 1719 05/30/18 1730 05/30/18 1734 05/30/18 1745  BP: (!) 134/99 (!) 132/103 (!) 133/97 (!) 133/97  Pulse: 67 65 67 67  Resp: 16 10 16 12   Temp: 98.1 F (36.7 C)  98.1 F (36.7 C)   TempSrc:      SpO2:  93%  97%  Weight:        Isolation Precautions No active isolations  Medications Medications  levofloxacin (LEVAQUIN) tablet 500 mg (has no administration in time range)  amLODipine (NORVASC) tablet 10 mg (has no administration in time range)  cloNIDine (CATAPRES) tablet 0.2 mg (has no administration in time range)  EPINEPHrine (EPI-PEN) 0.3 mg/0.3 mL injection (0.3 mg  Given 05/30/18 1521)  methylPREDNISolone sodium succinate (SOLU-MEDROL) 125 mg/2 mL injection 125 mg (125 mg Intravenous Given 05/30/18 1520)  diphenhydrAMINE (BENADRYL) injection 50 mg (50 mg Intravenous Given 05/30/18 1520)  famotidine (PEPCID) IVPB 20 mg premix (0 mg Intravenous Stopped 05/30/18 1553)  0.9 %  sodium chloride infusion (10 mL/hr Intravenous New Bag/Given 05/30/18 1520)    Mobility walks

## 2018-05-30 NOTE — ED Triage Notes (Addendum)
Patient BIB wife, c/o tongue swelling worsening x1 hour. Ate blueberries approximately one hour ago. Hx angioedema and intubation. Denies SOB.   States he took 400mg  Ibuprofen PTA.

## 2018-05-30 NOTE — H&P (Signed)
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  Treated in the emergency department with standard therapy and referred for observation.  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  Also admitted April 2018, May 2018, June 2019 in January 2020 for angioedema.  Was intubated in June 2019 but on most recent admission 04/2018, was observed overnight and did well with standard therapy.  Patient was getting ready to go to work today, ate a whole bunch of blueberries and suddenly developed tongue swelling on the left side.  No shortness of breath.  No lip edema.  This follows a standard pattern of swelling of the tongue on the left side.  No specific aggravating or alleviating factors.  His tongue edema seems to have stabilized here in the emergency department and this is not as bad an event as he has had in the past.  He has been evaluated twice recently in the emergency department for orchitis or epididymitis, has been treated with Levaquin and he reports that this is substantially improved, in fact to the point where he was going to return to work today.  ED Course: Treated with Benadryl, epinephrine, methylprednisolone, FFP  Review of Systems:  Negative for fever, new visual changes, sore throat, rash, new muscle aches, chest pain, SOB, dysuria, bleeding, n/v/abdominal pain.  Past Medical History:  Diagnosis Date  . Angioedema   . Hypertension     Past Surgical History:  Procedure Laterality Date  . surgery on genitals    . tracheosotmy       reports that he has been smoking cigarettes. He has quit using smokeless tobacco. He  reports previous alcohol use. He reports previous drug use. Drug: Marijuana. Mobility: Ambulatory  Allergies  Allergen Reactions  . Lidocaine Swelling  . Lisinopril Swelling and Other (See Comments)    Angioedema with Lisinopril/ HCTZ  . Ace Inhibitors Swelling and Other (See Comments)    Angioedema  . Blueberry Flavor     Blueberries angioedema    Family History  Problem Relation Age of Onset  . Urticaria Mother   . Angioedema Brother   . Asthma Neg Hx   . Allergic rhinitis Neg Hx   . Eczema Neg Hx   . Immunodeficiency Neg Hx      Prior to Admission medications   Medication Sig Start Date End Date Taking? Authorizing Provider  acetaminophen (TYLENOL) 325 MG tablet Take 325 mg by mouth every 6 (six) hours as needed for mild pain.   Yes [provider]  amLODipine (NORVASC) 10 MG tablet Take 1 tablet (10 mg total) by mouth daily. 01/05/18  Yes Ladell Pier, MD  cloNIDine (CATAPRES) 0.2 MG tablet Take 1 tablet (0.2 mg total) by mouth 2 (two) times daily. 01/05/18  Yes Ladell Pier, MD  HYDROcodone-acetaminophen (NORCO/VICODIN) 5-325 MG tablet Take 1-2 tablets by mouth every 6 (six) hours as needed for severe pain. 05/27/18  Yes Carmin Muskrat, MD  ibuprofen (ADVIL,MOTRIN) 200 MG tablet Take 400 mg by mouth daily as needed for moderate pain.   Yes [provider]  levofloxacin (LEVAQUIN) 500 MG tablet Take 1 tablet (500 mg total) by mouth daily. 05/25/18  Yes Davonna Belling, MD  EPINEPHrine 0.3 mg/0.3 mL IJ SOAJ injection Inject 0.3 mg into the muscle as needed for anaphylaxis.  09/10/17   [provider]  famotidine (PEPCID) 40 MG tablet Take 1 tablet (40 mg total) by mouth daily for 14 days. Patient not taking: Reported on 05/30/2018 04/24/18 05/08/18  Donne Hazel, MD  predniSONE (DELTASONE) 10 MG tablet Take 1 tablet (10 mg total) by mouth daily. Taper dose: 40mg  po daily x 2 days, then 20mg  po daily x 2 days, then 10mg  po daily x 2 days, then  5mg  po daily x 2 days then stop, zero refills Patient not taking: Reported on 05/30/2018 04/24/18   Donne Hazel, MD    Physical Exam: Vitals:   05/30/18 1800 05/30/18 1815  BP: (!) 151/107 (!) 133/106  Pulse: 65 64  Resp: 10 (!) 9  Temp:    SpO2: 99% 94%    Constitutional:   . Appears calm and comfortable Eyes:  . pupils and irises appear normal . Normal lids ENMT:  . grossly normal hearing  . Lips appear normal . The tongue is massively edematous and distorted, shifted to the right.  The hard palate appears grossly unremarkable, I cannot visualize the soft palate. Neck:  . neck appears normal, no masses,  . no thyromegaly Respiratory:  . CTA bilaterally, no w/r/r.  . Respiratory effort normal.  Cardiovascular:  . RRR, no m/r/g . No LE extremity edema   Abdomen:  . Soft, nontender, nondistended Musculoskeletal:  . Digits/nails BUE: no clubbing, cyanosis, petechiae, infection . RUE, LUE, RLE, LLE   . Tone and strength grossly normal. Skin:  . No rashes, lesions, ulcers . palpation of skin: no induration or nodules Neurologic:  . Grossly nonfocal Psychiatric:  . Mental status o Mood, affect appropriate . judgment and insight appear intact . Speech somewhat thick but intelligible.   I have personally reviewed following labs and imaging studies  Labs:   Creatinine slightly elevated 1.47, remainder BMP unremarkable  CBC unremarkable  Review and summation of old records:   As documented in HPI  Principal Problem:   Angioedema Active Problems:   Dehydration   Benign essential HTN   Assessment/Plan Recurrent angioedema, trigger today seems to be blueberries.  Has been worked up in the past including with C1 inhibitor esterase and complement levels which were unremarkable. --No lip or apparent airway edema.  No shortness of breath.  Patient feels like it stabilized and in fact is less severe than previous events. --No indication for intubation at this  point.  Plan observation to stepdown, steroids, H1 and H2 blockers.  Dehydration --IV fluids.  Check BMP in a.m.  Essential hypertension, stable --Continue clonidine and amlodipine  Severity of Illness: The appropriate patient status for this patient is OBSERVATION. Observation status is judged to be reasonable and necessary in order to provide the required intensity of service to ensure the patient's safety. The patient's presenting symptoms, physical exam findings, and initial radiographic and laboratory data in the context of their medical condition is felt to place them at decreased risk for further clinical deterioration. Furthermore, it is anticipated that the patient will be medically stable for discharge from the hospital within 2 midnights of admission. The following factors support the patient status of observation.   " The patient's presenting symptoms include tongue swelling. " The physical exam findings include massive edema of the tongue with distortion to the right, inability to visualize the soft palate. " The initial radiographic and laboratory data  are reassuring.   DVT prophylaxis: SCDs Code Status: Full Family Communication:  Consults called: none    Time spent: 11 minutes  Murray Hodgkins, MD  [facility] Direct contact: see [email or web address]  7PM-7AM contact night coverage as above  05/30/2018, 6:29 PM

## 2018-05-30 NOTE — ED Provider Notes (Addendum)
[facility] DEPT Provider Note   CSN: [id number] Arrival date & time: 05/30/18  1454    History   Chief Complaint Chief Complaint  Patient presents with  . Angioedema    HPI Robert Reyes is a 53 y.o. male.     HPI   53 yo M with h/o HTN, recurrent angioedema here w/ tongue swelling. Sx started in the last hour. He reports he ate blueberries before going to work today, which is new form him. When he got to work, he developed severe left tongue swelling w/ associated itching sensation. This is similar ot his prior episodes of angioedema. He reports it has rapidly worsened. Denies any SOB, difficulty swallowing. No lip swelling. H/o recurrent angioedema requiring intubation per wife and records. C1 esterase has been normal in the past.  No other complaints. No other new drugs. No rash. No n/v/d.  Past Medical History:  Diagnosis Date  . Angioedema   . Hypertension     Patient Active Problem List   Diagnosis Date Noted  . Allergic reaction 10/12/2016  . Tobacco dependence 08/20/2016  . Hypertension 07/22/2016  . Angioedema 07/02/2016    Past Surgical History:  Procedure Laterality Date  . surgery on genitals    . tracheosotmy          Home Medications    Prior to Admission medications   Medication Sig Start Date End Date Taking? Authorizing Provider  acetaminophen (TYLENOL) 325 MG tablet Take 325 mg by mouth every 6 (six) hours as needed for mild pain.   Yes [provider]  amLODipine (NORVASC) 10 MG tablet Take 1 tablet (10 mg total) by mouth daily. 01/05/18  Yes Ladell Pier, MD  cloNIDine (CATAPRES) 0.2 MG tablet Take 1 tablet (0.2 mg total) by mouth 2 (two) times daily. 01/05/18  Yes Ladell Pier, MD  HYDROcodone-acetaminophen (NORCO/VICODIN) 5-325 MG tablet Take 1-2 tablets by mouth every 6 (six) hours as needed for severe pain. 05/27/18  Yes Carmin Muskrat, MD  ibuprofen (ADVIL,MOTRIN) 200 MG tablet  Take 400 mg by mouth daily as needed for moderate pain.   Yes [provider]  levofloxacin (LEVAQUIN) 500 MG tablet Take 1 tablet (500 mg total) by mouth daily. 05/25/18  Yes Davonna Belling, MD  EPINEPHrine 0.3 mg/0.3 mL IJ SOAJ injection Inject 0.3 mg into the muscle as needed for anaphylaxis.  09/10/17   [provider]  famotidine (PEPCID) 40 MG tablet Take 1 tablet (40 mg total) by mouth daily for 14 days. Patient not taking: Reported on 05/30/2018 04/24/18 05/08/18  Donne Hazel, MD  predniSONE (DELTASONE) 10 MG tablet Take 1 tablet (10 mg total) by mouth daily. Taper dose: 40mg  po daily x 2 days, then 20mg  po daily x 2 days, then 10mg  po daily x 2 days, then 5mg  po daily x 2 days then stop, zero refills Patient not taking: Reported on 05/30/2018 04/24/18   Donne Hazel, MD    Family History Family History  Problem Relation Age of Onset  . Urticaria Mother   . Angioedema Brother   . Asthma Neg Hx   . Allergic rhinitis Neg Hx   . Eczema Neg Hx   . Immunodeficiency Neg Hx     Social History Social History   Tobacco Use  . Smoking status: Current Some Day Smoker    Types: Cigarettes  . Smokeless tobacco: Former Network engineer Use Topics  . Alcohol use: Not Currently    Comment:  occ  . Drug use: Not Currently    Types: Marijuana    Comment: stopped 2-3 mths ago  05/2016     Allergies   Lidocaine; Lisinopril; Ace inhibitors; and Blueberry flavor   Review of Systems Review of Systems  Constitutional: Negative for chills, fatigue and fever.  HENT: Positive for facial swelling and trouble swallowing. Negative for congestion and rhinorrhea.   Eyes: Negative for visual disturbance.  Respiratory: Negative for cough, shortness of breath and wheezing.   Cardiovascular: Negative for chest pain and leg swelling.  Gastrointestinal: Negative for abdominal pain, diarrhea, nausea and vomiting.  Genitourinary: Negative for dysuria and flank pain.    Musculoskeletal: Negative for neck pain and neck stiffness.  Skin: Negative for rash and wound.  Allergic/Immunologic: Negative for immunocompromised state.  Neurological: Negative for syncope, weakness and headaches.  All other systems reviewed and are negative.    Physical Exam Updated Vital Signs BP (!) 133/106   Pulse 64   Temp 98.1 F (36.7 C)   Resp (!) 9   Wt 95.3 kg   SpO2 94%   BMI 29.29 kg/m   Physical Exam Vitals signs and nursing note reviewed.  Constitutional:      General: He is not in acute distress.    Appearance: He is well-developed.  HENT:     Head: Normocephalic and atraumatic.     Comments: Marked asymmetric edema of tongue, L>R, with no lip or posterior pharyngeal edema or swelling noted. Phonation normal. No stridor. Eyes:     Conjunctiva/sclera: Conjunctivae normal.  Neck:     Musculoskeletal: Neck supple.  Cardiovascular:     Rate and Rhythm: Normal rate and regular rhythm.     Heart sounds: Normal heart sounds. No murmur. No friction rub.  Pulmonary:     Effort: Pulmonary effort is normal. No respiratory distress.     Breath sounds: Normal breath sounds. No wheezing or rales.  Abdominal:     General: There is no distension.     Palpations: Abdomen is soft.     Tenderness: There is no abdominal tenderness.  Skin:    General: Skin is warm.     Capillary Refill: Capillary refill takes less than 2 seconds.  Neurological:     Mental Status: He is alert and oriented to person, place, and time.     Motor: No abnormal muscle tone.      ED Treatments / Results  Labs (all labs ordered are listed, but only abnormal results are displayed) Labs Reviewed  CBC WITH DIFFERENTIAL/PLATELET - Abnormal; Notable for the following components:      Result Value   Hemoglobin 12.3 (*)    Abs Immature Granulocytes 0.18 (*)    All other components within normal limits  BASIC METABOLIC PANEL - Abnormal; Notable for the following components:   Glucose, Bld  137 (*)    Creatinine, Ser 1.47 (*)    GFR calc non Af Amer 55 (*)    All other components within normal limits  PREPARE FRESH FROZEN PLASMA  TYPE AND SCREEN  ABO/RH    EKG None  Radiology No results found.  Procedures .Critical Care Performed by: Duffy Bruce, MD Authorized by: Duffy Bruce, MD   Critical care provider statement:    Critical care time (minutes):  35   Critical care time was exclusive of:  Separately billable procedures and treating other patients and teaching time   Critical care was necessary to treat or prevent imminent or life-threatening deterioration of  the following conditions:  Circulatory failure, cardiac failure and respiratory failure   Critical care was time spent personally by me on the following activities:  Development of treatment plan with patient or surrogate, discussions with consultants, evaluation of patient's response to treatment, examination of patient, obtaining history from patient or surrogate, ordering and performing treatments and interventions, ordering and review of laboratory studies, ordering and review of radiographic studies, pulse oximetry, re-evaluation of patient's condition and review of old charts   I assumed direction of critical care for this patient from another provider in my specialty: no     (including critical care time)  Medications Ordered in ED Medications  levofloxacin (LEVAQUIN) tablet 500 mg (has no administration in time range)  amLODipine (NORVASC) tablet 10 mg (has no administration in time range)  cloNIDine (CATAPRES) tablet 0.2 mg (has no administration in time range)  EPINEPHrine (EPI-PEN) 0.3 mg/0.3 mL injection (0.3 mg  Given 05/30/18 1521)  methylPREDNISolone sodium succinate (SOLU-MEDROL) 125 mg/2 mL injection 125 mg (125 mg Intravenous Given 05/30/18 1520)  diphenhydrAMINE (BENADRYL) injection 50 mg (50 mg Intravenous Given 05/30/18 1520)  famotidine (PEPCID) IVPB 20 mg premix (0 mg Intravenous  Stopped 05/30/18 1553)  0.9 %  sodium chloride infusion (10 mL/hr Intravenous New Bag/Given 05/30/18 1520)     Initial Impression / Assessment and Plan / ED Course  I have reviewed the triage vital signs and the nursing notes.  Pertinent labs & imaging results that were available during my care of the patient were reviewed by me and considered in my medical decision making (see chart for details).       53 year old male here with recurrent angioedema.  Airway protected at this time but he does have significant tongue swelling.  Patient was given epi as well as full antihistamine and steroid treatment given that he has responded to this in the past.  Patient will also be transfused 2 units of FFP.  He was monitored for several hours with no progression of his angioedema and he feels mildly subjectively better.  However, given his extensive history including multiple intubations and cricothyrotomy, will admit to hospitalist service.  Final Clinical Impressions(s) / ED Diagnoses   Final diagnoses:  Angioedema, initial encounter    ED Discharge Orders    None       Duffy Bruce, MD 05/30/18 Cato Mulligan    Duffy Bruce, MD 05/30/18 [phone]

## 2018-05-30 NOTE — Care Management (Signed)
EDCM reviewed CM consult. Patient is being admitted. Unit CM to follow for discharge needs. Leshawn Straka RN CCM  

## 2018-05-30 NOTE — ED Notes (Signed)
Report given to [facility]

## 2018-05-31 LAB — PREPARE FRESH FROZEN PLASMA
Unit division: 0
Unit division: 0

## 2018-05-31 LAB — BPAM FFP
Blood Product Expiration Date: 202003012359
Blood Product Expiration Date: 202003012359
ISSUE DATE / TIME: 202002251710
ISSUE DATE / TIME: 202002252335
Unit Type and Rh: 1700
Unit Type and Rh: 7300

## 2018-05-31 LAB — ABO/RH: ABO/RH(D): B POS

## 2018-05-31 MED ORDER — HYDRALAZINE HCL 20 MG/ML IJ SOLN
5.0000 mg | INTRAMUSCULAR | Status: DC | PRN
  Administered 2018-05-31: 5 mg via INTRAVENOUS
  Filled 2018-05-31: qty 1

## 2018-05-31 NOTE — Progress Notes (Signed)
Patient states tongue is less swollen this morning. Denies any difficulty breathing. BP is decreasing after prn meds, orders received.

## 2018-05-31 NOTE — Discharge Summary (Signed)
Physician Discharge Summary  Patient ID: Robert Reyes MRN: [id number] DOB/AGE: [birth year] 51 y.o.  Admit date: 05/30/2018 Discharge date: 05/31/2018  Admission Diagnoses:  Principal Problem:   Angioedema Active Problems:   Dehydration   Benign essential HTN  Discharge Diagnoses:  Principal Problem:   Angioedema Active Problems:   Dehydration   Benign essential HTN   Discharged Condition: good  Presentation Summary:   53 year old man PMH recurrent angioedema, presented to the emergency department with sudden swelling on the left side of his tongue earlier today.  Treated in the emergency department with standard therapy and referred for observation . Patient has a history of recurrent angioedema including from ACE inhibitor, requiring emergent cricothyrotomy or tracheostomy by history in [place].  Also admitted April 2018, May 2018, June 2019 in January 2020 for angioedema.  Was intubated in June 2019 but on most recent admission 04/2018, was observed overnight and did well with standard therapy.  Patient was getting ready to go to work today, ate a whole bunch of blueberries and suddenly developed tongue swelling on the left side.  No shortness of breath.  No lip edema.  This follows a standard pattern of swelling of the tongue on the left side.  No specific aggravating or alleviating factors.  His tongue edema seems to have stabilized here in the emergency department and this is not as bad an event as he has had in the past.  He has been evaluated twice recently in the emergency department for orchitis or epididymitis, has been treated with Levaquin and he reports that this is substantially improved, in fact to the point where he was going to return to work today. ED Course: Treated with Benadryl, epinephrine, methylprednisolone, FFP  Hospital Problems/ Course:   Recurrent angioedema, trigger this seems to be blueberries.  Has been worked up in the past including with C1 inhibitor  esterase and complement levels which were unremarkable.  Pt has seen an allergist and had skin testing done.  We do not have the records but wife states the tests did not identify a certain cause.  -- No lip or apparent airway edema on admission. Pt was admitted to ICU and given IV steroids and IV H2B.  He is back to baseline today the next morning and wishes to go home.   -- Pt was counseled not to leave his EpiPen at home, he should take it with him wherever he goes.    Dehydration -- IVF's, resolved.   Orchitis: patient was taking Levaquin for this on admission and symptoms have mostly resolved. We cont'd the Levaquin whie here and he will finish out the Levaquin at home (-2 days for what he rec'd here).   Essential hypertension, stable --Continue clonidine and amlodipine   Discharge Exam: Blood pressure (!) 157/105, pulse 73, temperature (!) 97.5 F (36.4 C), temperature source Oral, resp. rate 19, height 5\' 11"  (1.803 m), weight 95.3 kg, SpO2 96 %. Alert, no didstress, WDWN No jvd, throat clear no tongue or lip swelling Chest cta bilat Cor reg no mrg Abd soft ntnd Ext no edema Neuro nf, ox3   Disposition: Discharge disposition: 01-Home or Self Care       Allergies as of 05/31/2018      Reactions   Lidocaine Swelling   Lisinopril Swelling, Other (See Comments)   Angioedema with Lisinopril/ HCTZ   Ace Inhibitors Swelling, Other (See Comments)   Angioedema   Blueberry Flavor    Blueberries angioedema      Medication  List    TAKE these medications   acetaminophen 325 MG tablet Commonly known as:  TYLENOL Take 325 mg by mouth every 6 (six) hours as needed for mild pain.   amLODipine 10 MG tablet Commonly known as:  NORVASC Take 1 tablet (10 mg total) by mouth daily.   cloNIDine 0.2 MG tablet Commonly known as:  CATAPRES Take 1 tablet (0.2 mg total) by mouth 2 (two) times daily.   EPINEPHrine 0.3 mg/0.3 mL Soaj injection Commonly known as:  EPI-PEN Inject  0.3 mg into the muscle as needed for anaphylaxis.   famotidine 40 MG tablet Commonly known as:  PEPCID Take 1 tablet (40 mg total) by mouth daily for 14 days.   HYDROcodone-acetaminophen 5-325 MG tablet Commonly known as:  NORCO/VICODIN Take 1-2 tablets by mouth every 6 (six) hours as needed for severe pain.   ibuprofen 200 MG tablet Commonly known as:  ADVIL,MOTRIN Take 400 mg by mouth daily as needed for moderate pain.   levofloxacin 500 MG tablet Commonly known as:  LEVAQUIN Take 1 tablet (500 mg total) by mouth daily.   predniSONE 10 MG tablet Commonly known as:  DELTASONE Take 1 tablet (10 mg total) by mouth daily. Taper dose: 40mg  po daily x 2 days, then 20mg  po daily x 2 days, then 10mg  po daily x 2 days, then 5mg  po daily x 2 days then stop, zero refills        Signed: Sol Blazing 05/31/2018, 9:01 AM

## 2018-05-31 NOTE — Care Management Note (Signed)
Case Management Note  Patient Details  Name: Robert Reyes MRN: [id number] Date of Birth: 22-Jan-1966  Subjective/Objective:                    Action/Plan: Pt has PCP, there are no needs at present time.    Expected Discharge Date:  05/31/18               Expected Discharge Plan:  Home/Self Care  In-House Referral:     Discharge planning Services  CM Consult  Post Acute Care Choice:    Choice offered to:     DME Arranged:    DME Agency:     HH Arranged:    HH Agency:     Status of Service:  Completed, signed off  If discussed at [employer] of Stay Meetings, dates discussed:    Additional CommentsPurcell Mouton, RN 05/31/2018, 10:07 AM

## 2018-05-31 NOTE — Plan of Care (Signed)
  Problem: Health Behavior/Discharge Planning: Goal: Ability to manage health-related needs will improve Outcome: Progressing   Problem: Clinical Measurements: Goal: Ability to maintain clinical measurements within normal limits will improve Outcome: Progressing Goal: Will remain free from infection Outcome: Progressing Goal: Diagnostic test results will improve Outcome: Progressing Goal: Respiratory complications will improve Outcome: Progressing Goal: Cardiovascular complication will be avoided Outcome: Progressing   Problem: Pain Managment: Goal: General experience of comfort will improve Outcome: Progressing   Problem: Safety: Goal: Ability to remain free from injury will improve Outcome: Progressing   

## 2018-05-31 NOTE — Plan of Care (Signed)
Monitoring swelling around oral and airway

## 2018-06-02 MED FILL — AMLODIPINE BESYLATE 10 MG T: 10 | 30 days supply | Qty: 30 | Fill #2

## 2018-07-19 MED FILL — AMLODIPINE BESYLATE 10 MG T: 10 | 30 days supply | Qty: 30 | Fill #3

## 2018-09-08 MED FILL — ?AMLODIPINE BESYLATE 10 MG: 10 | 30 days supply | Qty: 30 | Fill #4

## 2018-10-16 MED FILL — ?AMLODIPINE BESYLATE 10 MG: 10 | 30 days supply | Qty: 30 | Fill #5

## 2018-10-25 MED FILL — cloNIDine HCL 0.2 MG TABS: 0.2 | 30 days supply | Qty: 60 | Fill #2

## 2018-10-25 MED FILL — ?AMLODIPINE BESYLATE 10 MG: 10 | 30 days supply | Qty: 30 | Fill #6

## 2018-11-06 ENCOUNTER — Telehealth: Payer: Self-pay | Admitting: Internal Medicine

## 2018-11-06 NOTE — Telephone Encounter (Signed)
Pt called for sooner appt. Advised pt sign up for MyChart. Gave pt direct ph [phone] MyChart help line if difficulty signing up.

## 2018-11-20 ENCOUNTER — Other Ambulatory Visit: Payer: Self-pay

## 2018-11-20 ENCOUNTER — Encounter: Payer: Self-pay | Admitting: Internal Medicine

## 2018-11-20 ENCOUNTER — Ambulatory Visit: Payer: Self-pay | Attending: Internal Medicine | Admitting: Internal Medicine

## 2018-11-20 VITALS — BP 93/67 | HR 67 | Temp 97.9°F | Resp 16 | Ht 72.0 in | Wt 201.4 lb

## 2018-11-20 DIAGNOSIS — I10 Essential (primary) hypertension: Secondary | ICD-10-CM

## 2018-11-20 DIAGNOSIS — G4761 Periodic limb movement disorder: Secondary | ICD-10-CM

## 2018-11-20 DIAGNOSIS — G4733 Obstructive sleep apnea (adult) (pediatric): Secondary | ICD-10-CM

## 2018-11-20 DIAGNOSIS — Z87898 Personal history of other specified conditions: Secondary | ICD-10-CM

## 2018-11-20 MED ORDER — AMLODIPINE BESYLATE 10 MG PO TABS: 10.0000 mg | ORAL_TABLET | Freq: Every day | ORAL | 6 refills | Status: DC

## 2018-11-20 MED ORDER — EPINEPHRINE 0.3 MG/0.3ML IJ SOAJ: 0.3000 mg | INTRAMUSCULAR | 1 refills | Status: AC | PRN

## 2018-11-20 MED ORDER — CLONIDINE HCL 0.2 MG PO TABS: 0.2000 mg | ORAL_TABLET | Freq: Two times a day (BID) | ORAL | 6 refills | Status: DC

## 2018-11-20 NOTE — Patient Instructions (Signed)
Please give patient an appointment with the clinical pharmacist in 1 week for repeat blood pressure check.

## 2018-11-20 NOTE — Progress Notes (Signed)
Patient ID: Robert Reyes, male    DOB: 1966/03/15  MRN: [id number]  CC: Hypertension   Subjective: Robert Reyes is a 53 y.o. male who presents for chronic ds management  His concerns today include:  HTN, tob dep,recurrentangioedema  HYPERTENSION Currently taking: see medication list Med Adherence: [x]  Yes - Norvasc and Clonidine    []  No Medication side effects: []  Yes    [x]  No Adherence with salt restriction: [x]  Yes    []  No Home Monitoring?: Checked 4 days ago at [facility] and reading was 140s/100 Monitoring Frequency: []  Yes    []  No Home BP results range: []  Yes    []  No SOB? []  Yes    [x]  No Chest Pain?: []  Yes    [x]  No Leg swelling?: []  Yes    [x]  No Headaches?: []  Yes    [x]  No Dizziness? []  Yes    [x]  No Comments:   Recurrent Angioedema: He has not had any episodes in a while.    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  We decided conservative management rather than doing CPAP.  He reports no inc snorning, he wakes up in the mornings feeling refreshed.  Denies any morning headaches.   Never filled rxn for Requip.  Feels he does not need it.  Patient Active Problem List   Diagnosis Date Noted  . Dehydration 05/30/2018  . Benign essential HTN 05/30/2018  . Allergic reaction 10/12/2016  . Tobacco dependence 08/20/2016  . Hypertension 07/22/2016  . Angioedema 07/02/2016     No current outpatient medications on file prior to visit.   No current facility-administered medications on file prior to visit.     Allergies  Allergen Reactions  . Lidocaine Swelling  . Lisinopril Swelling and Other (See Comments)    Angioedema with Lisinopril/ HCTZ  . Ace Inhibitors Swelling and Other (See Comments)    Angioedema  . Blueberry Flavor     Blueberries angioedema    Social History   Socioeconomic History  . Marital status: Single    Spouse name: Not on file  . Number of children: Not on file  . Years of education: Not on file  . Highest education  level: Not on file  Occupational History  . Not on file  Social Needs  . Financial resource strain: Not on file  . Food insecurity    Worry: Not on file    Inability: Not on file  . Transportation needs    Medical: Not on file    Non-medical: Not on file  Tobacco Use  . Smoking status: Current Some Day Smoker    Types: Cigarettes  . Smokeless tobacco: Former Network engineer and Sexual Activity  . Alcohol use: Not Currently    Comment: occ  . Drug use: Not Currently    Types: Marijuana    Comment: stopped 2-3 mths ago  05/2016  . Sexual activity: Yes    Partners: Female  Lifestyle  . Physical activity    Days per week: Not on file    Minutes per session: Not on file  . Stress: Not on file  Relationships  . Social Herbalist on phone: Not on file    Gets together: Not on file    Attends religious service: Not on file    Active member of club or organization: Not on file    Attends meetings of clubs or organizations: Not on file    Relationship status: Not on file  .  Intimate partner violence    Fear of current or ex partner: Not on file    Emotionally abused: Not on file    Physically abused: Not on file    Forced sexual activity: Not on file  Other Topics Concern  . Not on file  Social History Narrative  . Not on file    Family History  Problem Relation Age of Onset  . Urticaria Mother   . Angioedema Brother   . Asthma Neg Hx   . Allergic rhinitis Neg Hx   . Eczema Neg Hx   . Immunodeficiency Neg Hx     Past Surgical History:  Procedure Laterality Date  . surgery on genitals    . tracheosotmy      ROS: Review of Systems Negative except as stated above  PHYSICAL EXAM: BP 93/67 (BP Location: Left Arm, Patient Position: Sitting, Cuff Size: Large)   Pulse 67   Temp 97.9 F (36.6 C) (Oral)   Resp 16   Ht 6' (1.829 m)   Wt 201 lb 6.4 oz (91.4 kg)   SpO2 100%   BMI 27.31 kg/m   Repeat blood pressure BP 92/78 Physical Exam  General  appearance - alert, well appearing, and in no distress Mental status - normal mood, behavior, speech, dress, motor activity, and thought processes Neck - supple, no significant adenopathy Chest - clear to auscultation, no wheezes, rales or rhonchi, symmetric air entry Heart - normal rate, regular rhythm, normal S1, S2, no murmurs, rubs, clicks or gallops Extremities - peripheral pulses normal, no pedal edema, no clubbing or cyanosis  CMP Latest Ref Rng & Units 05/30/2018 05/25/2018 04/24/2018  Glucose 70 - 99 mg/dL 137(H) 169(H) 159(H)  BUN 6 - 20 mg/dL 17 14 19   Creatinine 0.61 - 1.24 mg/dL 1.47(H) 1.13 1.20  Sodium 135 - 145 mmol/L 139 137 139  Potassium 3.5 - 5.1 mmol/L 4.1 3.5 4.5  Chloride 98 - 111 mmol/L 105 109 108  CO2 22 - 32 mmol/L 25 21(L) 22  Calcium 8.9 - 10.3 mg/dL 9.1 8.8(L) 9.3  Total Protein 6.5 - 8.1 g/dL - - -  Total Bilirubin 0.3 - 1.2 mg/dL - - -  Alkaline Phos 38 - 126 U/L - - -  AST 15 - 41 U/L - - -  ALT 17 - 63 U/L - - -   Lipid Panel     Component Value Date/Time   TRIG 77 09/07/2017 1741    CBC    Component Value Date/Time   WBC 9.1 05/30/2018 1508   RBC 4.28 05/30/2018 1508   HGB 12.3 (L) 05/30/2018 1508   HGB 14.4 01/05/2018 1145   HCT 40.3 05/30/2018 1508   HCT 42.8 01/05/2018 1145   PLT 358 05/30/2018 1508   PLT 239 01/05/2018 1145   MCV 94.2 05/30/2018 1508   MCV 91 01/05/2018 1145   MCH 28.7 05/30/2018 1508   MCHC 30.5 05/30/2018 1508   RDW 14.3 05/30/2018 1508   RDW 14.2 01/05/2018 1145   LYMPHSABS 2.6 05/30/2018 1508   MONOABS 0.6 05/30/2018 1508   EOSABS 0.1 05/30/2018 1508   BASOSABS 0.1 05/30/2018 1508    ASSESSMENT AND PLAN: 1. Essential hypertension Blood pressure today is on the low side.  However patient is asymptomatic. I will have him follow-up with our clinical pharmacist in about 1 week to repeat the blood pressure.  If still low we can cut the dose of clonidine in half. - amLODipine (NORVASC) 10 MG tablet; Take 1  tablet (10 mg total) by mouth daily.  Dispense: 30 tablet; Refill: 6 - cloNIDine (CATAPRES) 0.2 MG tablet; Take 1 tablet (0.2 mg total) by mouth 2 (two) times daily.  Dispense: 60 tablet; Refill: 6 - Comprehensive metabolic panel - Lipid panel  2. OSA (obstructive sleep apnea) 3. PLMD (periodic limb movement disorder) Patient doing well.  He is waking up refreshed and without morning headaches.  We will continue to monitor  4. History of angioedema No recent episodes.   Patient was given the opportunity to ask questions.  Patient verbalized understanding of the plan and was able to repeat key elements of the plan.   Orders Placed This Encounter  Procedures  . Comprehensive metabolic panel  . Lipid panel     Requested Prescriptions   Signed Prescriptions Disp Refills  . amLODipine (NORVASC) 10 MG tablet 30 tablet 6    Sig: Take 1 tablet (10 mg total) by mouth daily.  . cloNIDine (CATAPRES) 0.2 MG tablet 60 tablet 6    Sig: Take 1 tablet (0.2 mg total) by mouth 2 (two) times daily.  Marland Kitchen EPINEPHrine 0.3 mg/0.3 mL IJ SOAJ injection 1 each 1    Sig: Inject 0.3 mLs (0.3 mg total) into the muscle as needed for anaphylaxis.    Return in about 3 months (around 02/20/2019).  Karle Plumber, MD, FACP

## 2018-11-21 LAB — COMPREHENSIVE METABOLIC PANEL
ALT: 18 IU/L (ref 0–44)
AST: 17 IU/L (ref 0–40)
Albumin/Globulin Ratio: 2.1 (ref 1.2–2.2)
Albumin: 4.8 g/dL (ref 3.8–4.9)
Alkaline Phosphatase: 75 IU/L (ref 39–117)
BUN/Creatinine Ratio: 12 (ref 9–20)
BUN: 16 mg/dL (ref 6–24)
Bilirubin Total: 1 mg/dL (ref 0.0–1.2)
CO2: 23 mmol/L (ref 20–29)
Calcium: 9.8 mg/dL (ref 8.7–10.2)
Chloride: 104 mmol/L (ref 96–106)
Creatinine, Ser: 1.35 mg/dL — ABNORMAL HIGH (ref 0.76–1.27)
GFR calc Af Amer: 70 mL/min/{1.73_m2} (ref >59)
GFR calc non Af Amer: 60 mL/min/{1.73_m2} (ref >59)
Globulin, Total: 2.3 g/dL (ref 1.5–4.5)
Glucose: 97 mg/dL (ref 65–99)
Potassium: 5.3 mmol/L — ABNORMAL HIGH (ref 3.5–5.2)
Sodium: 142 mmol/L (ref 134–144)
Total Protein: 7.1 g/dL (ref 6.0–8.5)

## 2018-11-21 LAB — LIPID PANEL
Chol/HDL Ratio: 4 ratio (ref 0.0–5.0)
Cholesterol, Total: 173 mg/dL (ref 100–199)
HDL: 43 mg/dL (ref >39)
LDL Calculated: 110 mg/dL — ABNORMAL HIGH (ref 0–99)
Triglycerides: 98 mg/dL (ref 0–149)
VLDL Cholesterol Cal: 20 mg/dL (ref 5–40)

## 2018-11-22 ENCOUNTER — Telehealth: Payer: Self-pay | Admitting: Internal Medicine

## 2018-11-22 DIAGNOSIS — Z87891 Personal history of nicotine dependence: Secondary | ICD-10-CM

## 2018-11-22 DIAGNOSIS — E785 Hyperlipidemia, unspecified: Secondary | ICD-10-CM | POA: Insufficient documentation

## 2018-11-22 DIAGNOSIS — E7800 Pure hypercholesterolemia, unspecified: Secondary | ICD-10-CM

## 2018-11-22 DIAGNOSIS — E875 Hyperkalemia: Secondary | ICD-10-CM

## 2018-11-22 MED FILL — ?AMLODIPINE BESYLATE 10 MG: 10 | 30 days supply | Qty: 30 | Fill #0

## 2018-11-22 MED FILL — cloNIDine HCL 0.2 MG TABS: 0.2 | 30 days supply | Qty: 60 | Fill #0

## 2018-11-22 NOTE — Telephone Encounter (Signed)
  Phone call placed to patient this morning to go over lab results.  Patient advised that kidney function is not 100% but has improved from 5 months ago.  Advised to avoid taking NSAIDs. Potassium level mildly elevated.  Advised to cut back on potassium rich foods like oranges, orange juice and bananas.  Return to the lab in 1 week for repeat potassium check. LDL cholesterol elevated at 110 with goal being less than 100.  Dietary counseling given.  We do not have to start statin therapy at this point.  Patient no longer smokes.

## 2019-01-15 ENCOUNTER — Ambulatory Visit: Payer: Self-pay | Attending: Internal Medicine | Admitting: Pharmacist

## 2019-01-15 ENCOUNTER — Other Ambulatory Visit: Payer: Self-pay

## 2019-01-15 ENCOUNTER — Ambulatory Visit: Payer: Self-pay | Admitting: Pharmacist

## 2019-01-15 DIAGNOSIS — Z23 Encounter for immunization: Secondary | ICD-10-CM

## 2019-01-15 NOTE — Progress Notes (Signed)
Patient presents for vaccination against influenza per orders of Dr. Johnson. Consent given. Counseling provided. No contraindications exists. Vaccine administered without incident.   

## 2019-02-21 MED FILL — cloNIDine HCL 0.2 MG TABS: 0.2 | 30 days supply | Qty: 60 | Fill #2

## 2019-02-21 MED FILL — ?AMLODIPINE BESYLATE 10 MG: 10 | 30 days supply | Qty: 30 | Fill #2

## 2019-02-22 ENCOUNTER — Ambulatory Visit: Payer: Self-pay | Admitting: Internal Medicine

## 2019-04-11 MED FILL — ?AMLODIPINE BESYLATE 10 MG: 10 | 30 days supply | Qty: 30 | Fill #3

## 2019-04-11 MED FILL — cloNIDine HCL 0.2 MG TABS: 0.2 | 30 days supply | Qty: 60 | Fill #3

## 2019-04-26 ENCOUNTER — Other Ambulatory Visit: Payer: Self-pay

## 2019-04-26 ENCOUNTER — Ambulatory Visit: Payer: Self-pay | Attending: Internal Medicine | Admitting: Internal Medicine

## 2019-04-26 ENCOUNTER — Encounter: Payer: Self-pay | Admitting: Internal Medicine

## 2019-04-26 VITALS — BP 130/84 | HR 77 | Temp 98.6°F | Resp 16 | Wt 203.0 lb

## 2019-04-26 DIAGNOSIS — F17200 Nicotine dependence, unspecified, uncomplicated: Secondary | ICD-10-CM

## 2019-04-26 DIAGNOSIS — Z1211 Encounter for screening for malignant neoplasm of colon: Secondary | ICD-10-CM

## 2019-04-26 DIAGNOSIS — I10 Essential (primary) hypertension: Secondary | ICD-10-CM

## 2019-04-26 DIAGNOSIS — N529 Male erectile dysfunction, unspecified: Secondary | ICD-10-CM

## 2019-04-26 MED ORDER — SILDENAFIL CITRATE 50 MG PO TABS: ORAL_TABLET | ORAL | 2 refills | Status: AC

## 2019-04-26 MED FILL — SILDENAFIL CITRATE 50 MG TA: 50 | 30 days supply | Qty: 10 | Fill #0

## 2019-04-26 NOTE — Progress Notes (Signed)
Patient ID: Robert Reyes, male    DOB: [birth year]  MRN: [id number]  CC: Hypertension   Subjective: Robert Reyes is a 54 y.o. male who presents for chronic ds management His concerns today include:  HTN, tob dep,recurrentangioedema, mild OSA/PLMD  HYPERTENSION Currently taking: see medication list Med Adherence: [x]  Yes on Norvasc and Clonidine    []  No Medication side effects: []  Yes    [x]  No Adherence with salt restriction: [x]  Yes    []  No Home Monitoring?: []  Yes    [x]  No Monitoring Frequency: []  Yes    []  No Home BP results range: []  Yes    []  No SOB? []  Yes    [x]  No Chest Pain?: []  Yes    [x]  No Leg swelling?: []  Yes    [x]  No Headaches?: []  Yes    [x]  No Dizziness? []  Yes    [x]  No Comments:   Tob Dep:  Just about quit. Smokes 1 cig occasionally  C/o problems getting and maintaining erection x 8 mths.  Has relations only with his wife  HM:  Needs colon cancer screen Patient Active Problem List   Diagnosis Date Noted  . Hyperlipidemia 11/22/2018  . Former smoker 11/22/2018  . Dehydration 05/30/2018  . Benign essential HTN 05/30/2018  . Allergic reaction 10/12/2016  . Hypertension 07/22/2016  . Angioedema 07/02/2016     Current Outpatient Medications on File Prior to Visit  Medication Sig Dispense Refill  . amLODipine (NORVASC) 10 MG tablet Take 1 tablet (10 mg total) by mouth daily. 30 tablet 6  . cloNIDine (CATAPRES) 0.2 MG tablet Take 1 tablet (0.2 mg total) by mouth 2 (two) times daily. 60 tablet 6  . EPINEPHrine 0.3 mg/0.3 mL IJ SOAJ injection Inject 0.3 mLs (0.3 mg total) into the muscle as needed for anaphylaxis. (Patient not taking: Reported on 04/26/2019) 1 each 1   No current facility-administered medications on file prior to visit.    Allergies  Allergen Reactions  . Lidocaine Swelling  . Lisinopril Swelling and Other (See Comments)    Angioedema with Lisinopril/ HCTZ  . Ace Inhibitors Swelling and Other (See Comments)   Angioedema  . Blueberry Flavor     Blueberries angioedema    Social History   Socioeconomic History  . Marital status: Single    Spouse name: Not on file  . Number of children: Not on file  . Years of education: Not on file  . Highest education level: Not on file  Occupational History  . Not on file  Tobacco Use  . Smoking status: Current Some Day Smoker    Types: Cigarettes  . Smokeless tobacco: Former Network engineer and Sexual Activity  . Alcohol use: Not Currently    Comment: occ  . Drug use: Not Currently    Types: Marijuana    Comment: stopped 2-3 mths ago  05/2016  . Sexual activity: Yes    Partners: Female  Other Topics Concern  . Not on file  Social History Narrative  . Not on file   Social Determinants of Health   Financial Resource Strain:   . Difficulty of Paying Living Expenses: Not on file  Food Insecurity:   . Worried About Charity fundraiser in the Last Year: Not on file  . Ran Out of Food in the Last Year: Not on file  Transportation Needs:   . Lack of Transportation (Medical): Not on file  . Lack of Transportation (Non-Medical): Not on file  Physical Activity:   . Days of Exercise per Week: Not on file  . Minutes of Exercise per Session: Not on file  Stress:   . Feeling of Stress : Not on file  Social Connections:   . Frequency of Communication with Friends and Family: Not on file  . Frequency of Social Gatherings with Friends and Family: Not on file  . Attends Religious Services: Not on file  . Active Member of Clubs or Organizations: Not on file  . Attends Archivist Meetings: Not on file  . Marital Status: Not on file  Intimate Partner Violence:   . Fear of Current or Ex-Partner: Not on file  . Emotionally Abused: Not on file  . Physically Abused: Not on file  . Sexually Abused: Not on file    Family History  Problem Relation Age of Onset  . Urticaria Mother   . Angioedema Brother   . Asthma Neg Hx   . Allergic rhinitis  Neg Hx   . Eczema Neg Hx   . Immunodeficiency Neg Hx     Past Surgical History:  Procedure Laterality Date  . surgery on genitals    . tracheosotmy      ROS: Review of Systems Negative except as stated above  PHYSICAL EXAM: BP 130/84   Pulse 77   Temp 98.6 F (37 C) (Oral)   Resp 16   Wt 203 lb (92.1 kg)   SpO2 98%   BMI 27.53 kg/m   Physical Exam  General appearance - alert, well appearing, and in no distress Mental status - normal mood, behavior, speech, dress, motor activity, and thought processes Neck - supple, no significant adenopathy Chest - clear to auscultation, no wheezes, rales or rhonchi, symmetric air entry Heart - normal rate, regular rhythm, normal S1, S2, no murmurs, rubs, clicks or gallops Extremities - peripheral pulses normal, no pedal edema, no clubbing or cyanosis  CMP Latest Ref Rng & Units 11/20/2018 05/30/2018 05/25/2018  Glucose 65 - 99 mg/dL 97 137(H) 169(H)  BUN 6 - 24 mg/dL 16 17 14   Creatinine 0.76 - 1.27 mg/dL 1.35(H) 1.47(H) 1.13  Sodium 134 - 144 mmol/L 142 139 137  Potassium 3.5 - 5.2 mmol/L 5.3(H) 4.1 3.5  Chloride 96 - 106 mmol/L 104 105 109  CO2 20 - 29 mmol/L 23 25 21(L)  Calcium 8.7 - 10.2 mg/dL 9.8 9.1 8.8(L)  Total Protein 6.0 - 8.5 g/dL 7.1 - -  Total Bilirubin 0.0 - 1.2 mg/dL 1.0 - -  Alkaline Phos 39 - 117 IU/L 75 - -  AST 0 - 40 IU/L 17 - -  ALT 0 - 44 IU/L 18 - -   Lipid Panel     Component Value Date/Time   CHOL 173 11/20/2018 1122   TRIG 98 11/20/2018 1122   HDL 43 11/20/2018 1122   CHOLHDL 4.0 11/20/2018 1122   LDLCALC 110 (H) 11/20/2018 1122    CBC    Component Value Date/Time   WBC 9.1 05/30/2018 1508   RBC 4.28 05/30/2018 1508   HGB 12.3 (L) 05/30/2018 1508   HGB 14.4 01/05/2018 1145   HCT 40.3 05/30/2018 1508   HCT 42.8 01/05/2018 1145   PLT 358 05/30/2018 1508   PLT 239 01/05/2018 1145   MCV 94.2 05/30/2018 1508   MCV 91 01/05/2018 1145   MCH 28.7 05/30/2018 1508   MCHC 30.5 05/30/2018 1508    RDW 14.3 05/30/2018 1508   RDW 14.2 01/05/2018 1145   LYMPHSABS 2.6  05/30/2018 1508   MONOABS 0.6 05/30/2018 1508   EOSABS 0.1 05/30/2018 1508   BASOSABS 0.1 05/30/2018 1508    ASSESSMENT AND PLAN:  1. Essential hypertension Close to goal.  Continue current meds and DASH - Basic Metabolic Panel  2. Tobacco dependence Commended him on almost quitting.  Encourage him to quit completely.  He does not feel he needs nicotine replacement  3. Erectile dysfunction, unspecified erectile dysfunction type Discuss dx of ED.  Discussed trail of viagra Pt advised of possible side effects of this medication including prolong erection, flushing, headaches, stuff nose and sudden vision and hearing changes.  Pt told to be seen in ER if he has erection lasting longer than 3-4 hrs, or if he has sudden vision changes or hearing loss. - sildenafil (VIAGRA) 50 MG tablet; Take 1 tablet half hour to 1 hour before sexual intercourse.  Limit use to 1 tablet per 24 hours.  Dispense: 10 tablet; Refill: 2  4. Screening for colon cancer - Fecal occult blood, imunochemical([facility])   Patient was given the opportunity to ask questions.  Patient verbalized understanding of the plan and was able to repeat key elements of the plan.   Orders Placed This Encounter  Procedures  . Fecal occult blood, imunochemical([facility])  . Basic Metabolic Panel     Requested Prescriptions   Signed Prescriptions Disp Refills  . sildenafil (VIAGRA) 50 MG tablet 10 tablet 2    Sig: Take 1 tablet half hour to 1 hour before sexual intercourse.  Limit use to 1 tablet per 24 hours.    Return in about 4 months (around 08/24/2019).  Karle Plumber, MD, FACP

## 2019-04-26 NOTE — Patient Instructions (Signed)
Erectile Dysfunction Erectile dysfunction (ED) is the inability to get or keep an erection in order to have sexual intercourse. Erectile dysfunction may include:  Inability to get an erection.  Lack of enough hardness of the erection to allow penetration.  Loss of the erection before sex is finished. What are the causes? This condition may be caused by:  Certain medicines, such as: ? Pain relievers. ? Antihistamines. ? Antidepressants. ? Blood pressure medicines. ? Water pills (diuretics). ? Ulcer medicines. ? Muscle relaxants. ? Drugs.  Excessive drinking.  Psychological causes, such as: ? Anxiety. ? Depression. ? Sadness. ? Exhaustion. ? Performance fear. ? Stress.  Physical causes, such as: ? Artery problems. This may include diabetes, smoking, liver disease, or atherosclerosis. ? High blood pressure. ? Hormonal problems, such as low testosterone. ? Obesity. ? Nerve problems. This may include back or pelvic injuries, diabetes mellitus, multiple sclerosis, or Parkinson disease. What are the signs or symptoms? Symptoms of this condition include:  Inability to get an erection.  Lack of enough hardness of the erection to allow penetration.  Loss of the erection before sex is finished.  Normal erections at some times, but with frequent unsatisfactory episodes.  Low sexual satisfaction in either partner due to erection problems.  A curved penis occurring with erection. The curve may cause pain or the penis may be too curved to allow for intercourse.  Never having nighttime erections. How is this diagnosed? This condition is often diagnosed by:  Performing a physical exam to find other diseases or specific problems with the penis.  Asking you detailed questions about the problem.  Performing blood tests to check for diabetes mellitus or to measure hormone levels.  Performing other tests to check for underlying health conditions.  Performing an ultrasound  exam to check for scarring.  Performing a test to check blood flow to the penis.  Doing a sleep study at home to measure nighttime erections. How is this treated? This condition may be treated by:  Medicine taken by mouth to help you achieve an erection (oral medicine).  Hormone replacement therapy to replace low testosterone levels.  Medicine that is injected into the penis. Your health care provider may instruct you how to give yourself these injections at home.  Vacuum pump. This is a pump with a ring on it. The pump and ring are placed on the penis and used to create pressure that helps the penis become erect.  Penile implant surgery. In this procedure, you may receive: ? An inflatable implant. This consists of cylinders, a pump, and a reservoir. The cylinders can be inflated with a fluid that helps to create an erection, and they can be deflated after intercourse. ? A semi-rigid implant. This consists of two silicone rubber rods. The rods provide some rigidity. They are also flexible, so the penis can both curve downward in its normal position and become straight for sexual intercourse.  Blood vessel surgery, to improve blood flow to the penis. During this procedure, a blood vessel from a different part of the body is placed into the penis to allow blood to flow around (bypass) damaged or blocked blood vessels.  Lifestyle changes, such as exercising more, losing weight, and quitting smoking. Follow these instructions at home: Medicines   Take over-the-counter and prescription medicines only as told by your health care provider. Do not increase the dosage without first discussing it with your health care provider.  If you are using self-injections, perform injections as directed by your   health care provider. Make sure to avoid any veins that are on the surface of the penis. After giving an injection, apply pressure to the injection site for 5 minutes. General  instructions  Exercise regularly, as directed by your health care provider. Work with your health care provider to lose weight, if needed.  Do not use any products that contain nicotine or tobacco, such as cigarettes and e-cigarettes. If you need help quitting, ask your health care provider.  Before using a vacuum pump, read the instructions that come with the pump and discuss any questions with your health care provider.  Keep all follow-up visits as told by your health care provider. This is important. Contact a health care provider if:  You feel nauseous.  You vomit. Get help right away if:  You are taking oral or injectable medicines and you have an erection that lasts longer than 4 hours. If your health care provider is unavailable, go to the nearest emergency room for evaluation. An erection that lasts much longer than 4 hours can result in permanent damage to your penis.  You have severe pain in your groin or abdomen.  You develop redness or severe swelling of your penis.  You have redness spreading up into your groin or lower abdomen.  You are unable to urinate.  You experience chest pain or a rapid heart beat (palpitations) after taking oral medicines. Summary  Erectile dysfunction (ED) is the inability to get or keep an erection during sexual intercourse. This problem can usually be treated successfully.  This condition is diagnosed based on a physical exam, your symptoms, and tests to determine the cause. Treatment varies depending on the cause, and may include medicines, hormone therapy, surgery, or vacuum pump.  You may need follow-up visits to make sure that you are using your medicines or devices correctly.  Get help right away if you are taking or injecting medicines and you have an erection that lasts longer than 4 hours. This information is not intended to replace advice given to you by your health care provider. Make sure you discuss any questions you have with  your health care provider. Document Revised: 03/04/2017 Document Reviewed: 04/07/2016 Elsevier Patient Education  2020 Elsevier Inc[employer]  

## 2019-04-27 LAB — BASIC METABOLIC PANEL
BUN/Creatinine Ratio: 13 (ref 9–20)
BUN: 13 mg/dL (ref 6–24)
CO2: 25 mmol/L (ref 20–29)
Calcium: 9.7 mg/dL (ref 8.7–10.2)
Chloride: 104 mmol/L (ref 96–106)
Creatinine, Ser: 1.02 mg/dL (ref 0.76–1.27)
GFR calc Af Amer: 98 mL/min/{1.73_m2} (ref >59)
GFR calc non Af Amer: 85 mL/min/{1.73_m2} (ref >59)
Glucose: 98 mg/dL (ref 65–99)
Potassium: 4.8 mmol/L (ref 3.5–5.2)
Sodium: 142 mmol/L (ref 134–144)

## 2019-05-01 ENCOUNTER — Telehealth: Payer: Self-pay

## 2019-05-01 NOTE — Telephone Encounter (Signed)
Contacted pt to go over lab results pt is aware and doesn't have any questions or concerns 

## 2019-05-11 LAB — FECAL OCCULT BLOOD, IMMUNOCHEMICAL: Fecal Occult Bld: NEGATIVE

## 2019-05-14 MED FILL — cloNIDine HCL 0.2 MG TABS: 0.2 | 30 days supply | Qty: 60 | Fill #4

## 2019-05-14 MED FILL — AMLODIPINE BESYLATE 10 MG T: 10 | 30 days supply | Qty: 30 | Fill #4

## 2019-05-15 ENCOUNTER — Telehealth: Payer: Self-pay

## 2019-05-15 NOTE — Telephone Encounter (Signed)
Contacted pt to go over [employer] results pt was busy and provided wife results. Pt wife doesn't have any questions or concerns

## 2019-06-27 MED FILL — ?CLONIDINE HCL 0.2 MG TABS: 0.2 | 30 days supply | Qty: 60 | Fill #5

## 2019-06-27 MED FILL — AMLODIPINE BESYLATE 10 MG T: 10 | 30 days supply | Qty: 30 | Fill #5

## 2019-06-30 ENCOUNTER — Ambulatory Visit: Payer: Self-pay

## 2019-07-30 MED FILL — ?CLONIDINE HCL 0.2 MG TABS: 0.2 | 30 days supply | Qty: 60 | Fill #6

## 2019-07-30 MED FILL — AMLODIPINE BESYLATE 10 MG T: 10 | 30 days supply | Qty: 30 | Fill #6

## 2019-09-26 ENCOUNTER — Other Ambulatory Visit: Payer: Self-pay | Admitting: Internal Medicine

## 2019-09-26 DIAGNOSIS — I10 Essential (primary) hypertension: Secondary | ICD-10-CM

## 2019-09-26 MED FILL — cloNIDine HCL 0.2 MG TABS: 0.2 | 30 days supply | Qty: 60 | Fill #0

## 2019-09-26 MED FILL — AMLODIPINE BESYLATE 10 MG T: 10 | 30 days supply | Qty: 30 | Fill #0

## 2019-10-16 ENCOUNTER — Encounter: Payer: Self-pay | Admitting: Internal Medicine

## 2019-10-16 ENCOUNTER — Other Ambulatory Visit: Payer: Self-pay

## 2019-10-16 ENCOUNTER — Ambulatory Visit: Payer: BC Managed Care – PPO | Attending: Internal Medicine | Admitting: Internal Medicine

## 2019-10-16 ENCOUNTER — Other Ambulatory Visit: Payer: Self-pay | Admitting: Internal Medicine

## 2019-10-16 VITALS — BP 134/90 | HR 79 | Temp 97.5°F | Resp 16 | Wt 198.0 lb

## 2019-10-16 DIAGNOSIS — F17200 Nicotine dependence, unspecified, uncomplicated: Secondary | ICD-10-CM

## 2019-10-16 DIAGNOSIS — G4733 Obstructive sleep apnea (adult) (pediatric): Secondary | ICD-10-CM

## 2019-10-16 DIAGNOSIS — E663 Overweight: Secondary | ICD-10-CM

## 2019-10-16 DIAGNOSIS — I10 Essential (primary) hypertension: Secondary | ICD-10-CM

## 2019-10-16 MED ORDER — AMLODIPINE BESYLATE 10 MG PO TABS: 10.0000 mg | ORAL_TABLET | Freq: Every day | ORAL | 6 refills | Status: DC

## 2019-10-16 MED ORDER — CARVEDILOL 3.125 MG PO TABS: 3.1250 mg | ORAL_TABLET | Freq: Two times a day (BID) | ORAL | 6 refills | Status: DC

## 2019-10-16 MED ORDER — CLONIDINE HCL 0.2 MG PO TABS: 0.2000 mg | ORAL_TABLET | Freq: Two times a day (BID) | ORAL | 6 refills | Status: DC

## 2019-10-16 MED FILL — CARVEDILOL 3.125 MG TABLET: 3.125 | 30 days supply | Qty: 60 | Fill #0

## 2019-10-16 NOTE — Progress Notes (Signed)
Patient ID: Robert Reyes, male    DOB: January 04, 1966  MRN: [id number]  CC: Hypertension   Subjective: Robert Reyes is a 54 y.o. male who presents for chronic disease management. His concerns today include:  HTN, tob dep,recurrentangioedema, mild OSA/PLMD, ED  ED:  Viagra worked.  HYPERTENSION Currently taking: see medication list Med Adherence: [x]  Yes -he is on clonidine and amlodipine. Medication side effects: []  Yes    [x]  No Adherence with salt restriction: [x]  Yes    []  No Home Monitoring?: []  Yes    [x]  No Monitoring Frequency: []  Yes    []  No Home BP results range: []  Yes    []  No SOB? []  Yes    [x]  No Chest Pain?: []  Yes    [x]  No Leg swelling?: []  Yes    [x]  No Headaches?: []  Yes    [x]  No Dizziness? []  Yes    [x]  No Comments:   Wife concern that he falls asleep whenever he sits down.  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  2 x a wk, on his day off, he drives to [place] to check on his mother-in-law. Leaves 9 a.m and returns home 1-2 a.m.  No falling asleep when driving. Overall gets in 4-6 hrs of sleep at nights.  He has history of mild sleep apnea diagnosed in 2019 for which conservative measures were being tried.  He endorses daytime sleepiness and loud snoring.  Tob dep: quit since last visit  Overwgh:  Loss 5 lbs since last visit.  On feet and does a lot of walking during the day.  Eating a lot more veggies and fruits.  HM:  Had [place] vaccine 3/17 and 07/18/2019  Patient Active Problem List   Diagnosis Date Noted  . Hyperlipidemia 11/22/2018  . Former smoker 11/22/2018  . Dehydration 05/30/2018  . Benign essential HTN 05/30/2018  . Allergic reaction 10/12/2016  . Hypertension 07/22/2016  . Angioedema 07/02/2016     Current Outpatient Medications on File Prior to Visit  Medication Sig Dispense Refill  . EPINEPHrine 0.3 mg/0.3 mL IJ SOAJ injection Inject 0.3 mLs (0.3 mg total)  into the muscle as needed for anaphylaxis. (Patient not taking: Reported on 04/26/2019) 1 each 1  . sildenafil (VIAGRA) 50 MG tablet Take 1 tablet half hour to 1 hour before sexual intercourse.  Limit use to 1 tablet per 24 hours. 10 tablet 2   No current facility-administered medications on file prior to visit.    Allergies  Allergen Reactions  . Lidocaine Swelling  . Lisinopril Swelling and Other (See Comments)    Angioedema with Lisinopril/ HCTZ  . Ace Inhibitors Swelling and Other (See Comments)    Angioedema  . Blueberry Flavor     Blueberries angioedema    Social History   Socioeconomic History  . Marital status: Single    Spouse name: Not on file  . Number of children: Not on file  . Years of education: Not on file  . Highest education level: Not on file  Occupational History  . Not on file  Tobacco Use  . Smoking status: Former Smoker    Types: Cigarettes  . Smokeless tobacco: Former Network engineer  . Vaping Use: Never used  Substance and Sexual Activity  . Alcohol use: Not Currently    Comment: occ  . Drug use: Not Currently    Types: Marijuana  Comment: stopped 2-3 mths ago  05/2016  . Sexual activity: Yes    Partners: Female  Other Topics Concern  . Not on file  Social History Narrative  . Not on file   Social Determinants of Health   Financial Resource Strain:   . Difficulty of Paying Living Expenses:   Food Insecurity:   . Worried About Charity fundraiser in the Last Year:   . Arboriculturist in the Last Year:   Transportation Needs:   . Film/video editor (Medical):   Marland Kitchen Lack of Transportation (Non-Medical):   Physical Activity:   . Days of Exercise per Week:   . Minutes of Exercise per Session:   Stress:   . Feeling of Stress :   Social Connections:   . Frequency of Communication with Friends and Family:   . Frequency of Social Gatherings with Friends and Family:   . Attends Religious Services:   . Active Member of Clubs or  Organizations:   . Attends Archivist Meetings:   Marland Kitchen Marital Status:   Intimate Partner Violence:   . Fear of Current or Ex-Partner:   . Emotionally Abused:   Marland Kitchen Physically Abused:   . Sexually Abused:     Family History  Problem Relation Age of Onset  . Urticaria Mother   . Angioedema Brother   . Asthma Neg Hx   . Allergic rhinitis Neg Hx   . Eczema Neg Hx   . Immunodeficiency Neg Hx     Past Surgical History:  Procedure Laterality Date  . surgery on genitals    . tracheosotmy      ROS: Review of Systems Negative except as stated above  PHYSICAL EXAM: BP 134/90   Pulse 79   Temp (!) 97.5 F (36.4 C)   Resp 16   Wt 198 lb (89.8 kg)   SpO2 99%   BMI 26.85 kg/m   Wt Readings from Last 3 Encounters:  10/16/19 198 lb (89.8 kg)  04/26/19 203 lb (92.1 kg)  11/20/18 201 lb 6.4 oz (91.4 kg)    Physical Exam General appearance - alert, well appearing, and in no distress Mental status - normal mood, behavior, speech, dress, motor activity, and thought processes Neck - supple, no significant adenopathy Chest - clear to auscultation, no wheezes, rales or rhonchi, symmetric air entry Heart - normal rate, regular rhythm, normal S1, S2, no murmurs, rubs, clicks or gallops Neurological -neurologic exam is nonfocal.   Extremities - peripheral pulses normal, no pedal edema, no clubbing or cyanosis  CMP Latest Ref Rng & Units 04/26/2019 11/20/2018 05/30/2018  Glucose 65 - 99 mg/dL 98 97 137(H)  BUN 6 - 24 mg/dL 13 16 17   Creatinine 0.76 - 1.27 mg/dL 1.02 1.35(H) 1.47(H)  Sodium 134 - 144 mmol/L 142 142 139  Potassium 3.5 - 5.2 mmol/L 4.8 5.3(H) 4.1  Chloride 96 - 106 mmol/L 104 104 105  CO2 20 - 29 mmol/L 25 23 25   Calcium 8.7 - 10.2 mg/dL 9.7 9.8 9.1  Total Protein 6.0 - 8.5 g/dL - 7.1 -  Total Bilirubin 0.0 - 1.2 mg/dL - 1.0 -  Alkaline Phos 39 - 117 IU/L - 75 -  AST 0 - 40 IU/L - 17 -  ALT 0 - 44 IU/L - 18 -   Lipid Panel     Component Value Date/Time    CHOL 173 11/20/2018 1122   TRIG 98 11/20/2018 1122   HDL 43 11/20/2018 1122  CHOLHDL 4.0 11/20/2018 1122   LDLCALC 110 (H) 11/20/2018 1122    CBC    Component Value Date/Time   WBC 9.1 05/30/2018 1508   RBC 4.28 05/30/2018 1508   HGB 12.3 (L) 05/30/2018 1508   HGB 14.4 01/05/2018 1145   HCT 40.3 05/30/2018 1508   HCT 42.8 01/05/2018 1145   PLT 358 05/30/2018 1508   PLT 239 01/05/2018 1145   MCV 94.2 05/30/2018 1508   MCV 91 01/05/2018 1145   MCH 28.7 05/30/2018 1508   MCHC 30.5 05/30/2018 1508   RDW 14.3 05/30/2018 1508   RDW 14.2 01/05/2018 1145   LYMPHSABS 2.6 05/30/2018 1508   MONOABS 0.6 05/30/2018 1508   EOSABS 0.1 05/30/2018 1508   BASOSABS 0.1 05/30/2018 1508    ASSESSMENT AND PLAN: 1. Essential hypertension Not at goal.  Add low-dose of carvedilol.  Follow-up with clinical pharmacist in 1 month for repeat blood pressure check. - cloNIDine (CATAPRES) 0.2 MG tablet; Take 1 tablet (0.2 mg total) by mouth 2 (two) times daily.  Dispense: 60 tablet; Refill: 6 - amLODipine (NORVASC) 10 MG tablet; Take 1 tablet (10 mg total) by mouth daily.  Dispense: 30 tablet; Refill: 6 - carvedilol (COREG) 3.125 MG tablet; Take 1 tablet (3.125 mg total) by mouth 2 (two) times daily with a meal.  Dispense: 60 tablet; Refill: 6  2. Tobacco dependence Commended him on quitting.  Encouraged him to remain tobacco free.  Less than 5 minutes spent on counseling  3. OSA (obstructive sleep apnea) Recommend that he try to get in at least 8 hours of sleep at nights.  I think sleep deprivation on top of having sleep apnea is probably causing the increase sleepiness.  We will do a repeat sleep study to see whether his sleep apnea has progressed to the extent that he needs a CPAP. - Home sleep test  4. Overweight Commended him on weight loss.  Encouraged him to stay active.  Healthy eating habits discussed.    Patient was given the opportunity to ask questions.  Patient verbalized understanding  of the plan and was able to repeat key elements of the plan.   Orders Placed This Encounter  Procedures  . Home sleep test     Requested Prescriptions   Signed Prescriptions Disp Refills  . cloNIDine (CATAPRES) 0.2 MG tablet 60 tablet 6    Sig: Take 1 tablet (0.2 mg total) by mouth 2 (two) times daily.  Marland Kitchen amLODipine (NORVASC) 10 MG tablet 30 tablet 6    Sig: Take 1 tablet (10 mg total) by mouth daily.  . carvedilol (COREG) 3.125 MG tablet 60 tablet 6    Sig: Take 1 tablet (3.125 mg total) by mouth 2 (two) times daily with a meal.    Return in about 4 months (around 02/16/2020) for [facility] in 1 mth for BP recheck.  Karle Plumber, MD, FACP

## 2019-10-16 NOTE — Patient Instructions (Signed)
Try to get in at least 8 hours of sleep at nights. We will put you in for another sleep study to see whether the sleep apnea has worsened.  If so you will need a CPAP machine.  Your blood pressure is not at goal.  We have added another medication called carvedilol 3.125 mg twice a day.  Continue the clonidine and Norvasc.

## 2019-11-07 MED FILL — AMLODIPINE BESYLATE 10 MG T: 10 | 30 days supply | Qty: 30 | Fill #0

## 2019-11-07 MED FILL — cloNIDine HCL 0.2 MG TABS: 0.2 | 30 days supply | Qty: 60 | Fill #0

## 2019-11-07 MED FILL — CARVEDILOL 3.125 MG TABLET: 3.125 | 30 days supply | Qty: 60 | Fill #0

## 2019-11-13 ENCOUNTER — Telehealth: Payer: Self-pay

## 2019-11-13 NOTE — Telephone Encounter (Signed)
[facility] to get [facility]. Had to lvm. Will wait on a call back before calling pt.

## 2019-12-11 MED FILL — cloNIDine HCL 0.2 MG TABS: 0.2 | 30 days supply | Qty: 60 | Fill #1

## 2019-12-11 MED FILL — CARVEDILOL 3.125 MG TABLET: 3.125 | 30 days supply | Qty: 60 | Fill #1

## 2019-12-11 MED FILL — AMLODIPINE BESYLATE 10 MG T: 10 | 30 days supply | Qty: 30 | Fill #1

## 2019-12-26 ENCOUNTER — Other Ambulatory Visit: Payer: Self-pay

## 2019-12-26 ENCOUNTER — Ambulatory Visit: Payer: BC Managed Care – PPO | Attending: Internal Medicine | Admitting: Pharmacist

## 2019-12-26 DIAGNOSIS — Z23 Encounter for immunization: Secondary | ICD-10-CM

## 2019-12-26 NOTE — Progress Notes (Signed)
Patient presents for vaccination against influenza per orders of Dr. Johnson. Consent given. Counseling provided. No contraindications exists. Vaccine administered without incident.  ° °Luke Van Ausdall, PharmD, CPP °Clinical Pharmacist °[facility] °[phone] ° °

## 2020-01-25 MED FILL — AMLODIPINE BESYLATE 10 MG T: 10 | 30 days supply | Qty: 30 | Fill #2

## 2020-01-25 MED FILL — cloNIDine HCL 0.2 MG TABS: 0.2 | 30 days supply | Qty: 60 | Fill #2

## 2020-01-25 MED FILL — CARVEDILOL 3.125 MG TABLET: 3.125 | 30 days supply | Qty: 60 | Fill #2

## 2020-03-07 MED FILL — cloNIDine HCL 0.2 MG TABS: 0.2 | 30 days supply | Qty: 60 | Fill #3

## 2020-03-07 MED FILL — AMLODIPINE BESYLATE 10 MG T: 10 | 30 days supply | Qty: 30 | Fill #3

## 2020-03-07 MED FILL — CARVEDILOL 3.125 MG TABLET: 3.125 | 30 days supply | Qty: 60 | Fill #3

## 2020-04-01 ENCOUNTER — Emergency Department (HOSPITAL_COMMUNITY)
Admission: EM | Admit: 2020-04-01 | Discharge: 2020-04-01 | Disposition: A | Discharge status: Home / Self Care | Payer: BC Managed Care – PPO | Attending: Emergency Medicine | Admitting: Emergency Medicine

## 2020-04-01 ENCOUNTER — Emergency Department (HOSPITAL_COMMUNITY): Payer: BC Managed Care – PPO

## 2020-04-01 ENCOUNTER — Encounter (HOSPITAL_COMMUNITY): Payer: Self-pay

## 2020-04-01 ENCOUNTER — Other Ambulatory Visit: Payer: BC Managed Care – PPO

## 2020-04-01 ENCOUNTER — Other Ambulatory Visit: Payer: Self-pay

## 2020-04-01 DIAGNOSIS — Z87891 Personal history of nicotine dependence: Secondary | ICD-10-CM | POA: Insufficient documentation

## 2020-04-01 DIAGNOSIS — Z79899 Other long term (current) drug therapy: Secondary | ICD-10-CM | POA: Insufficient documentation

## 2020-04-01 DIAGNOSIS — Z20822 Contact with and (suspected) exposure to covid-19: Secondary | ICD-10-CM | POA: Diagnosis not present

## 2020-04-01 DIAGNOSIS — R059 Cough, unspecified: Secondary | ICD-10-CM | POA: Diagnosis present

## 2020-04-01 DIAGNOSIS — I10 Essential (primary) hypertension: Secondary | ICD-10-CM | POA: Insufficient documentation

## 2020-04-01 DIAGNOSIS — R0602 Shortness of breath: Secondary | ICD-10-CM

## 2020-04-01 DIAGNOSIS — J069 Acute upper respiratory infection, unspecified: Secondary | ICD-10-CM | POA: Diagnosis not present

## 2020-04-01 LAB — RESP PANEL BY RT-PCR (FLU A&B, COVID) ARPGX2
Influenza A by PCR: NEGATIVE
Influenza B by PCR: NEGATIVE
SARS Coronavirus 2 by RT PCR: NEGATIVE

## 2020-04-01 NOTE — Discharge Instructions (Signed)
Please follow-up with primary care doctor.  Return to ER if you develop chest pain, difficulty breathing or other new concerning symptom.

## 2020-04-01 NOTE — ED Provider Notes (Signed)
[facility]-EMERGENCY DEPT Provider Note   CSN: [id number] Arrival date & time: 04/01/20  8185     History Chief Complaint  Patient presents with  . Cough    Robert Reyes is a 54 y.o. male.  Presents to ER with concern for mild cough for the past couple days.  States has felt somewhat fatigued but no other symptoms.  Cough is nonproductive.  No known Covid contacts, some other family members have similar symptoms.  Denies any new medications recently.  Does have history of hypertension and angioedema.  HPI     Past Medical History:  Diagnosis Date  . Angioedema   . Hypertension     Patient Active Problem List   Diagnosis Date Noted  . Hyperlipidemia 11/22/2018  . Former smoker 11/22/2018  . Dehydration 05/30/2018  . Benign essential HTN 05/30/2018  . Allergic reaction 10/12/2016  . Hypertension 07/22/2016  . Angioedema 07/02/2016    Past Surgical History:  Procedure Laterality Date  . surgery on genitals    . tracheosotmy         Family History  Problem Relation Age of Onset  . Urticaria Mother   . Angioedema Brother   . Asthma Neg Hx   . Allergic rhinitis Neg Hx   . Eczema Neg Hx   . Immunodeficiency Neg Hx     Social History   Tobacco Use  . Smoking status: Former Smoker    Types: Cigarettes  . Smokeless tobacco: Former Clinical biochemist  . Vaping Use: Never used  Substance Use Topics  . Alcohol use: Not Currently    Comment: occ  . Drug use: Not Currently    Types: Marijuana    Comment: stopped 2-3 mths ago  05/2016    Home Medications Prior to Admission medications   Medication Sig Start Date End Date Taking? Authorizing Provider  amLODipine (NORVASC) 10 MG tablet Take 1 tablet (10 mg total) by mouth daily. 10/16/19   Marcine Matar, MD  carvedilol (COREG) 3.125 MG tablet Take 1 tablet (3.125 mg total) by mouth 2 (two) times daily with a meal. 10/16/19   Marcine Matar, MD  cloNIDine (CATAPRES) 0.2  MG tablet Take 1 tablet (0.2 mg total) by mouth 2 (two) times daily. 10/16/19   Marcine Matar, MD  EPINEPHrine 0.3 mg/0.3 mL IJ SOAJ injection Inject 0.3 mLs (0.3 mg total) into the muscle as needed for anaphylaxis. Patient not taking: Reported on 04/26/2019 11/20/18   Marcine Matar, MD  sildenafil (VIAGRA) 50 MG tablet Take 1 tablet half hour to 1 hour before sexual intercourse.  Limit use to 1 tablet per 24 hours. 04/26/19   Marcine Matar, MD    Allergies    Lidocaine, Lisinopril, Ace inhibitors, and Blueberry flavor  Review of Systems   Review of Systems  Constitutional: Negative for chills and fever.  HENT: Negative for ear pain and sore throat.   Eyes: Negative for pain and visual disturbance.  Respiratory: Positive for cough. Negative for shortness of breath.   Cardiovascular: Negative for chest pain and palpitations.  Gastrointestinal: Negative for abdominal pain and vomiting.  Genitourinary: Negative for dysuria and hematuria.  Musculoskeletal: Negative for arthralgias and back pain.  Skin: Negative for color change and rash.  Neurological: Negative for seizures and syncope.  All other systems reviewed and are negative.   Physical Exam Updated Vital Signs BP 138/90 (BP Location: Right Arm)   Pulse 80   Temp 98  F (36.7 C) (Oral)   Resp 16   SpO2 99%   Physical Exam Vitals and nursing note reviewed.  Constitutional:      Appearance: He is well-developed and well-nourished.  HENT:     Head: Normocephalic and atraumatic.  Eyes:     Conjunctiva/sclera: Conjunctivae normal.  Cardiovascular:     Rate and Rhythm: Normal rate and regular rhythm.     Heart sounds: No murmur heard.   Pulmonary:     Effort: Pulmonary effort is normal. No respiratory distress.     Breath sounds: Normal breath sounds.  Abdominal:     Palpations: Abdomen is soft.     Tenderness: There is no abdominal tenderness.  Musculoskeletal:        General: No edema.     Cervical back:  Neck supple.  Skin:    General: Skin is warm and dry.  Neurological:     Mental Status: He is alert.  Psychiatric:        Mood and Affect: Mood and affect normal.     ED Results / Procedures / Treatments   Labs (all labs ordered are listed, but only abnormal results are displayed) Labs Reviewed  RESP PANEL BY RT-PCR (FLU A&B, COVID) [id number]    EKG None  Radiology DG Chest Port 1 View  Result Date: 04/01/2020 CLINICAL DATA:  Cough EXAM: PORTABLE CHEST 1 VIEW COMPARISON:  September 09, 2017 FINDINGS: Lungs are clear. Heart size and pulmonary vascularity are normal. No adenopathy. No bone lesions. IMPRESSION: Lungs clear.  Cardiac silhouette normal. Electronically Signed   By: Lowella Grip III M.D.   On: 04/01/2020 09:38    Procedures Procedures (including critical care time)  Medications Ordered in ED Medications - No data to display  ED Course  I have reviewed the triage vital signs and the nursing notes.  Pertinent labs & imaging results that were available during my care of the patient were reviewed by me and considered in my medical decision making (see chart for details).    MDM Rules/Calculators/A&P                          54 year old male presents to ER with concern for cough.  CXR negative.  Covid negative.  Suspect viral URI.  Recommend follow-up with primary doctor.  Will discharge home.  After the discussed management above, the patient was determined to be safe for discharge.  The patient was in agreement with this plan and all questions regarding their care were answered.  ED return precautions were discussed and the patient will return to the ED with any significant worsening of condition.  Final Clinical Impression(s) / ED Diagnoses Final diagnoses:  Viral URI with cough    Rx / DC Orders ED Discharge Orders    None       Lucrezia Starch, MD 04/01/20 1701

## 2020-04-01 NOTE — ED Triage Notes (Signed)
Pt presents with c/o cogh for 2 days.

## 2020-04-23 MED FILL — cloNIDine HCL 0.2 MG TABS: 0.2 | 30 days supply | Qty: 60 | Fill #4

## 2020-04-23 MED FILL — AMLODIPINE BESYLATE 10 MG T: 10 | 30 days supply | Qty: 30 | Fill #4

## 2020-04-23 MED FILL — CARVEDILOL 3.125 MG TABLET: 3.125 | 30 days supply | Qty: 60 | Fill #4

## 2020-04-24 ENCOUNTER — Encounter: Payer: BC Managed Care – PPO | Admitting: Internal Medicine

## 2020-04-25 ENCOUNTER — Ambulatory Visit (INDEPENDENT_AMBULATORY_CARE_PROVIDER_SITE_OTHER): Payer: BC Managed Care – PPO | Admitting: Internal Medicine

## 2020-04-25 ENCOUNTER — Telehealth: Payer: Self-pay

## 2020-04-25 DIAGNOSIS — U071 COVID-19: Secondary | ICD-10-CM

## 2020-04-25 NOTE — Progress Notes (Signed)
Virtual Visit via Telephone Note  I connected with Robert Reyes, on 04/25/2020 at 9:55 AM by telephone due to the COVID-19 pandemic and verified that I am speaking with the correct person using two identifiers.   Consent: I discussed the limitations, risks, security and privacy concerns of performing an evaluation and management service by telephone and the availability of in person appointments. I also discussed with the patient that there may be a patient responsible charge related to this service. The patient expressed understanding and agreed to proceed.   Location of Patient: Home   Location of Provider: Home    Persons participating in Telemedicine visit: Cordelia Poche [facility] Dr. Juleen China      History of Present Illness: Patient has a visit for COVID+. Was tested on 1/18 and resulted positive on 1/19. Patient reports he is feeling alright. Asymptomatic. Patient reports that he and his wife get tested often. Patient is fully vaccinated and boosted. He would like antibody infusion.    Past Medical History:  Diagnosis Date  . Angioedema   . Hypertension    Allergies  Allergen Reactions  . Lidocaine Swelling  . Lisinopril Swelling and Other (See Comments)    Angioedema with Lisinopril/ HCTZ  . Ace Inhibitors Swelling and Other (See Comments)    Angioedema  . Blueberry Flavor     Blueberries angioedema    Current Outpatient Medications on File Prior to Visit  Medication Sig Dispense Refill  . amLODipine (NORVASC) 10 MG tablet Take 1 tablet (10 mg total) by mouth daily. 30 tablet 6  . carvedilol (COREG) 3.125 MG tablet Take 1 tablet (3.125 mg total) by mouth 2 (two) times daily with a meal. 60 tablet 6  . cloNIDine (CATAPRES) 0.2 MG tablet Take 1 tablet (0.2 mg total) by mouth 2 (two) times daily. 60 tablet 6  . EPINEPHrine 0.3 mg/0.3 mL IJ SOAJ injection Inject 0.3 mLs (0.3 mg total) into the muscle as needed for anaphylaxis. (Patient not  taking: No sig reported) 1 each 1  . sildenafil (VIAGRA) 50 MG tablet Take 1 tablet half hour to 1 hour before sexual intercourse.  Limit use to 1 tablet per 24 hours. (Patient not taking: Reported on 04/25/2020) 10 tablet 2   No current facility-administered medications on file prior to visit.    Observations/Objective: NAD. Speaking clearly.  Work of breathing normal.  Alert and oriented. Mood appropriate.   Assessment and Plan: 1. Lab test positive for detection of COVID-19 virus Patient essentially asymptomatic and positive status found incidentally as patient frequently tests. Discussed need for full 5 day quarantine and then another 5 days of strict mask adherence assuming remains asymptomatic and afebrile. Encouraged 3-W's continuously. Discussed with patient that monoclonal antibodies usually reserved for those with mild to moderate symptoms with risk factors for hospitalization. Patient does have underlying medical conditions that make him higher risk but he is fully vaccinated and asymptomatic. He is insistent I put in referral for consideration. I have done so.  - Ambulatory referral for Covid Treatment   Follow Up Instructions: PRN and for routine medical care with PCP    I discussed the assessment and treatment plan with the patient. The patient was provided an opportunity to ask questions and all were answered. The patient agreed with the plan and demonstrated an understanding of the instructions.   The patient was advised to call back or seek an in-person evaluation if the symptoms worsen or if the condition fails to improve as  anticipated.     I provided 9 minutes total of non-face-to-face time during this encounter including median intraservice time, reviewing previous notes, investigations, ordering medications, medical decision making, coordinating care and patient verbalized understanding at the end of the visit.    Phill Myron, D.O. Primary Care at [facility]  04/25/2020, 9:55 AM

## 2020-04-25 NOTE — Progress Notes (Signed)
Positive COVID test on 1/19, wants to get infusions

## 2020-04-25 NOTE — Telephone Encounter (Signed)
Reason for CRM: Pt called stating that he tested positive for covid 04/23/20. Pt states that he would like to have the covid infusion and is requesting to have a call back. Please advise.  Pt has an virtual visit with Dr. Juleen China 1/21 at 950am

## 2020-05-16 ENCOUNTER — Encounter: Payer: Self-pay | Admitting: Internal Medicine

## 2020-05-16 ENCOUNTER — Ambulatory Visit: Payer: BC Managed Care – PPO | Attending: Internal Medicine | Admitting: Internal Medicine

## 2020-05-16 ENCOUNTER — Other Ambulatory Visit: Payer: Self-pay

## 2020-05-16 ENCOUNTER — Other Ambulatory Visit: Payer: Self-pay | Admitting: Internal Medicine

## 2020-05-16 VITALS — BP 121/81 | HR 67 | Resp 16 | Ht 71.0 in | Wt 197.8 lb

## 2020-05-16 DIAGNOSIS — Z1211 Encounter for screening for malignant neoplasm of colon: Secondary | ICD-10-CM

## 2020-05-16 DIAGNOSIS — E663 Overweight: Secondary | ICD-10-CM | POA: Diagnosis not present

## 2020-05-16 DIAGNOSIS — R400 Somnolence: Secondary | ICD-10-CM

## 2020-05-16 DIAGNOSIS — G4733 Obstructive sleep apnea (adult) (pediatric): Secondary | ICD-10-CM | POA: Diagnosis not present

## 2020-05-16 DIAGNOSIS — I10 Essential (primary) hypertension: Secondary | ICD-10-CM

## 2020-05-16 DIAGNOSIS — Z9229 Personal history of other drug therapy: Secondary | ICD-10-CM

## 2020-05-16 MED ORDER — CARVEDILOL 3.125 MG PO TABS: 3.1250 mg | ORAL_TABLET | Freq: Two times a day (BID) | ORAL | 1 refills | Status: DC

## 2020-05-16 MED ORDER — AMLODIPINE BESYLATE 10 MG PO TABS: 10.0000 mg | ORAL_TABLET | Freq: Every day | ORAL | 1 refills | Status: DC

## 2020-05-16 MED ORDER — CLONIDINE HCL 0.2 MG PO TABS: 0.2000 mg | ORAL_TABLET | Freq: Two times a day (BID) | ORAL | 1 refills | Status: DC

## 2020-05-16 MED FILL — CARVEDILOL 3.125 MG TABLET: 3.125 | 90 days supply | Qty: 180 | Fill #0

## 2020-05-16 MED FILL — cloNIDine HCL 0.2 MG TABS: 0.2 | 90 days supply | Qty: 180 | Fill #0

## 2020-05-16 MED FILL — AMLODIPINE BESYLATE 10 MG T: 10 | 90 days supply | Qty: 90 | Fill #0

## 2020-05-16 NOTE — Progress Notes (Signed)
Patient ID: Robert Reyes, male    DOB: [birth year]  MRN: [id number]  CC: Annual Exam   Subjective: Robert Reyes is a 55 y.o. male who presents for chronic ds management His concerns today include:  HTN, tob dep,recurrentangioedema, mild OSA/PLMD, ED, COVID-19 infection  Pt tested positive for COVID last mth.  Pt was asymptomattic.  Patient states that he and his wife get tested every 3 to 4 weeks just because they now have her mother who is 16 years old living with them -Had all 3 shots of Moderna.  He does not have his card with him today for me to get the date of his booster shot.  Getting more sleep now that his mother-in-law is living with them and he is not having to drive to try to keep up to households. On last visit daytime sleepiness loud snoring.  He reports that the daytime sleepiness is a little better.  He had a sleep study done in 2019 which revealed mild sleep apnea.  We agreed on conservative management at that time.  However on last visit, given his symptoms we agreed to another sleep study to be done at home.  However patient states he was never called.  He would still like to have this done because of his symptoms.   HYPERTENSION Currently taking: see medication list Med Adherence: [x]  Yes.  He is on amlodipine, carvedilol,.  Refills on medications. Medication side effects: []  Yes    [x]  No Adherence with salt restriction: [x]  Yes    []  No Home Monitoring?: [x]  Yes at [facility] intermittently Monitoring Frequency: []  Yes    []  No Home BP results range: []  Yes    []  No SOB? []  Yes    [x]  No Chest Pain?: []  Yes    [x]  No Leg swelling?: []  Yes    [x]  No Headaches?: []  Yes    [x]  No Dizziness? 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  He does okay with his eating habits.  States that his wife gets on him and encourages him to eat more while he is at work.  He  works 12-hour shifts.  He does eat at work on his lunch hour but otherwise does not do any snack.  His wife wants him to snack.  HM:  Due for colon CA screen.  No fhx history colon cancer Patient Active Problem List   Diagnosis Date Noted  . Hyperlipidemia 11/22/2018  . Former smoker 11/22/2018  . Dehydration 05/30/2018  . Benign essential HTN 05/30/2018  . Allergic reaction 10/12/2016  . Hypertension 07/22/2016  . Angioedema 07/02/2016     Current Outpatient Medications on File Prior to Visit  Medication Sig Dispense Refill  . EPINEPHrine 0.3 mg/0.3 mL IJ SOAJ injection Inject 0.3 mLs (0.3 mg total) into the muscle as needed for anaphylaxis. (Patient not taking: No sig reported) 1 each 1  . sildenafil (VIAGRA) 50 MG tablet Take 1 tablet half hour to 1 hour before sexual intercourse.  Limit use to 1 tablet per 24 hours. (Patient not taking: Reported on 04/25/2020) 10 tablet 2   No current facility-administered medications on file prior to visit.    Allergies  Allergen Reactions  . Lidocaine Swelling  . Lisinopril Swelling and Other (See Comments)    Angioedema with Lisinopril/ HCTZ  . Ace Inhibitors Swelling and Other (See  Comments)    Angioedema  . Blueberry Flavor     Blueberries angioedema    Social History   Socioeconomic History  . Marital status: Single    Spouse name: Not on file  . Number of children: Not on file  . Years of education: Not on file  . Highest education level: Not on file  Occupational History  . Not on file  Tobacco Use  . Smoking status: Former Smoker    Types: Cigarettes  . Smokeless tobacco: Former Network engineer  . Vaping Use: Never used  Substance and Sexual Activity  . Alcohol use: Not Currently    Comment: occ  . Drug use: Not Currently    Types: Marijuana    Comment: stopped 2-3 mths ago  05/2016  . Sexual activity: Yes    Partners: Female  Other Topics Concern  . Not on file  Social History Narrative  . Not on file    Social Determinants of Health   Financial Resource Strain: Not on file  Food Insecurity: Not on file  Transportation Needs: Not on file  Physical Activity: Not on file  Stress: Not on file  Social Connections: Not on file  Intimate Partner Violence: Not on file    Family History  Problem Relation Age of Onset  . Urticaria Mother   . Angioedema Brother   . Asthma Neg Hx   . Allergic rhinitis Neg Hx   . Eczema Neg Hx   . Immunodeficiency Neg Hx     Past Surgical History:  Procedure Laterality Date  . surgery on genitals    . tracheosotmy      ROS: Review of Systems Negative except as stated above  PHYSICAL EXAM: BP 121/81   Pulse 67   Resp 16   Ht 5\' 11"  (1.803 m)   Wt 197 lb 12.8 oz (89.7 kg)   SpO2 98%   BMI 27.59 kg/m   Wt Readings from Last 3 Encounters:  05/16/20 197 lb 12.8 oz (89.7 kg)  10/16/19 198 lb (89.8 kg)  04/26/19 203 lb (92.1 kg)    Physical Exam   General appearance - alert, well appearing, and in no distress Mental status - normal mood, behavior, speech, dress, motor activity, and thought processes Neck - supple, no significant adenopathy Chest - clear to auscultation, no wheezes, rales or rhonchi, symmetric air entry Heart - normal rate, regular rhythm, normal S1, S2, no murmurs, rubs, clicks or gallops Extremities - peripheral pulses normal, no pedal edema, no clubbing or cyanosis  CMP Latest Ref Rng & Units 04/26/2019 11/20/2018 05/30/2018  Glucose 65 - 99 mg/dL 98 97 137(H)  BUN 6 - 24 mg/dL 13 16 17   Creatinine 0.76 - 1.27 mg/dL 1.02 1.35(H) 1.47(H)  Sodium 134 - 144 mmol/L 142 142 139  Potassium 3.5 - 5.2 mmol/L 4.8 5.3(H) 4.1  Chloride 96 - 106 mmol/L 104 104 105  CO2 20 - 29 mmol/L 25 23 25   Calcium 8.7 - 10.2 mg/dL 9.7 9.8 9.1  Total Protein 6.0 - 8.5 g/dL - 7.1 -  Total Bilirubin 0.0 - 1.2 mg/dL - 1.0 -  Alkaline Phos 39 - 117 IU/L - 75 -  AST 0 - 40 IU/L - 17 -  ALT 0 - 44 IU/L - 18 -   Lipid Panel     Component  Value Date/Time   CHOL 173 11/20/2018 1122   TRIG 98 11/20/2018 1122   HDL 43 11/20/2018 1122   CHOLHDL 4.0  11/20/2018 1122   LDLCALC 110 (H) 11/20/2018 1122    CBC    Component Value Date/Time   WBC 9.1 05/30/2018 1508   RBC 4.28 05/30/2018 1508   HGB 12.3 (L) 05/30/2018 1508   HGB 14.4 01/05/2018 1145   HCT 40.3 05/30/2018 1508   HCT 42.8 01/05/2018 1145   PLT 358 05/30/2018 1508   PLT 239 01/05/2018 1145   MCV 94.2 05/30/2018 1508   MCV 91 01/05/2018 1145   MCH 28.7 05/30/2018 1508   MCHC 30.5 05/30/2018 1508   RDW 14.3 05/30/2018 1508   RDW 14.2 01/05/2018 1145   LYMPHSABS 2.6 05/30/2018 1508   MONOABS 0.6 05/30/2018 1508   EOSABS 0.1 05/30/2018 1508   BASOSABS 0.1 05/30/2018 1508    ASSESSMENT AND PLAN: 1. Essential hypertension Close to goal. Continue current meds - carvedilol (COREG) 3.125 MG tablet; Take 1 tablet (3.125 mg total) by mouth 2 (two) times daily with a meal.  Dispense: 180 tablet; Refill: 1 - amLODipine (NORVASC) 10 MG tablet; Take 1 tablet (10 mg total) by mouth daily.  Dispense: 90 tablet; Refill: 1 - cloNIDine (CATAPRES) 0.2 MG tablet; Take 1 tablet (0.2 mg total) by mouth 2 (two) times daily.  Dispense: 180 tablet; Refill: 1  2. Overweight Encouraged him to continue with healthy eating habits.  Encouraged him to get in at least 2-3 servings of fruits a day which can be his snacks.  Continue to stay active.  3. OSA (obstructive sleep apnea) 4. Daytime sleepiness Given his daytime sleepiness and loud snoring I will resubmit the referral for the home sleep study. - Home sleep test; Future   5. Screening for colon cancer Discussed colon cancer screening methods.  He had test last year.  This time he wants to do colonoscopy.  Referral submitted to gastroenterology. - Ambulatory referral to Gastroenterology  6. COVID-19 vaccine series completed Asked that he bring his card with him on next visit so that I can get the date of the COVID  booster.    Patient was given the opportunity to ask questions.  Patient verbalized understanding of the plan and was able to repeat key elements of the plan.   Orders Placed This Encounter  Procedures  . Ambulatory referral to Gastroenterology  . Home sleep test     Requested Prescriptions   Signed Prescriptions Disp Refills  . carvedilol (COREG) 3.125 MG tablet 180 tablet 1    Sig: Take 1 tablet (3.125 mg total) by mouth 2 (two) times daily with a meal.  . amLODipine (NORVASC) 10 MG tablet 90 tablet 1    Sig: Take 1 tablet (10 mg total) by mouth daily.  . cloNIDine (CATAPRES) 0.2 MG tablet 180 tablet 1    Sig: Take 1 tablet (0.2 mg total) by mouth 2 (two) times daily.    Return in about 4 months (around 09/13/2020).  Karle Plumber, MD, FACP

## 2020-06-05 ENCOUNTER — Encounter: Payer: Self-pay | Admitting: Gastroenterology

## 2020-06-05 ENCOUNTER — Encounter: Payer: Self-pay | Admitting: Internal Medicine

## 2020-06-26 ENCOUNTER — Encounter: Payer: BC Managed Care – PPO | Admitting: Internal Medicine

## 2020-07-03 ENCOUNTER — Telehealth: Payer: Self-pay | Admitting: Internal Medicine

## 2020-07-03 ENCOUNTER — Encounter: Payer: Self-pay | Admitting: Internal Medicine

## 2020-07-03 ENCOUNTER — Ambulatory Visit: Payer: BC Managed Care – PPO | Attending: Internal Medicine | Admitting: Internal Medicine

## 2020-07-03 ENCOUNTER — Other Ambulatory Visit: Payer: Self-pay

## 2020-07-03 VITALS — BP 119/82 | HR 79 | Ht 71.5 in | Wt 198.4 lb

## 2020-07-03 DIAGNOSIS — G8929 Other chronic pain: Secondary | ICD-10-CM

## 2020-07-03 DIAGNOSIS — Z23 Encounter for immunization: Secondary | ICD-10-CM | POA: Diagnosis not present

## 2020-07-03 DIAGNOSIS — Z1159 Encounter for screening for other viral diseases: Secondary | ICD-10-CM

## 2020-07-03 DIAGNOSIS — M25561 Pain in right knee: Secondary | ICD-10-CM | POA: Diagnosis not present

## 2020-07-03 DIAGNOSIS — Z0000 Encounter for general adult medical examination without abnormal findings: Secondary | ICD-10-CM

## 2020-07-03 DIAGNOSIS — Z1211 Encounter for screening for malignant neoplasm of colon: Secondary | ICD-10-CM

## 2020-07-03 NOTE — Patient Instructions (Signed)
I recommend that you purchase and use a pair of kneepads at work. Purchase Voltaren gel over-the-counter and use it on the right knee 3-4 times a day as needed. We will check with [facility] about sending you the kit to do the home sleep study.   Health Maintenance, Male Adopting a healthy lifestyle and getting preventive care are important in promoting health and wellness. Ask your health care provider about:  The right schedule for you to have regular tests and exams.  Things you can do on your own to prevent diseases and keep yourself healthy. What should I know about diet, weight, and exercise? Eat a healthy diet  Eat a diet that includes plenty of vegetables, fruits, low-fat dairy products, and lean protein.  Do not eat a lot of foods that are high in solid fats, added sugars, or sodium.   Maintain a healthy weight Body mass index (BMI) is a measurement that can be used to identify possible weight problems. It estimates body fat based on height and weight. Your health care provider can help determine your BMI and help you achieve or maintain a healthy weight. Get regular exercise Get regular exercise. This is one of the most important things you can do for your health. Most adults should:  Exercise for at least 150 minutes each week. The exercise should increase your heart rate and make you sweat (moderate-intensity exercise).  Do strengthening exercises at least twice a week. This is in addition to the moderate-intensity exercise.  Spend less time sitting. Even light physical activity can be beneficial. Watch cholesterol and blood lipids Have your blood tested for lipids and cholesterol at 53 years of age, then have this test every 5 years. You may need to have your cholesterol levels checked more often if:  Your lipid or cholesterol levels are high.  You are older than 53 years of age.  You are at high risk for heart disease. What should I know about cancer  screening? Many types of cancers can be detected early and may often be prevented. Depending on your health history and family history, you may need to have cancer screening at various ages. This may include screening for:  Colorectal cancer.  Prostate cancer.  Skin cancer.  Lung cancer. What should I know about heart disease, diabetes, and high blood pressure? Blood pressure and heart disease  High blood pressure causes heart disease and increases the risk of stroke. This is more likely to develop in people who have high blood pressure readings, are of African descent, or are overweight.  Talk with your health care provider about your target blood pressure readings.  Have your blood pressure checked: ? Every 3-5 years if you are 55-38 years of age. ? Every year if you are 74 years old or older.  If you are between the ages of 49 and 53 and are a current or former smoker, ask your health care provider if you should have a one-time screening for abdominal aortic aneurysm (AAA). Diabetes Have regular diabetes screenings. This checks your fasting blood sugar level. Have the screening done:  Once every three years after age 64 if you are at a normal weight and have a low risk for diabetes.  More often and at a younger age if you are overweight or have a high risk for diabetes. What should I know about preventing infection? Hepatitis B If you have a higher risk for hepatitis B, you should be screened for this virus. Talk  with your health care provider to find out if you are at risk for hepatitis B infection. Hepatitis C Blood testing is recommended for:  Everyone born from [birth year] through 1963.  Anyone with known risk factors for hepatitis C. Sexually transmitted infections (STIs)  You should be screened each year for STIs, including gonorrhea and chlamydia, if: ? You are sexually active and are younger than 53 years of age. ? You are older than 53 years of age and your health care  provider tells you that you are at risk for this type of infection. ? Your sexual activity has changed since you were last screened, and you are at increased risk for chlamydia or gonorrhea. Ask your health care provider if you are at risk.  Ask your health care provider about whether you are at high risk for HIV. Your health care provider may recommend a prescription medicine to help prevent HIV infection. If you choose to take medicine to prevent HIV, you should first get tested for HIV. You should then be tested every 3 months for as long as you are taking the medicine. Follow these instructions at home: Lifestyle  Do not use any products that contain nicotine or tobacco, such as cigarettes, e-cigarettes, and chewing tobacco. If you need help quitting, ask your health care provider.  Do not use street drugs.  Do not share needles.  Ask your health care provider for help if you need support or information about quitting drugs. Alcohol use  Do not drink alcohol if your health care provider tells you not to drink.  If you drink alcohol: ? Limit how much you have to 0-2 drinks a day. ? Be aware of how much alcohol is in your drink. In the [place] one drink equals one 12 oz bottle of beer (355 mL), one 5 oz glass of wine (148 mL), or one 1 oz glass of hard liquor (44 mL). General instructions  Schedule regular health, dental, and eye exams.  Stay current with your vaccines.  Tell your health care provider if: ? You often feel depressed. ? You have ever been abused or do not feel safe at home. Summary  Adopting a healthy lifestyle and getting preventive care are important in promoting health and wellness.  Follow your health care provider's instructions about healthy diet, exercising, and getting tested or screened for diseases.  Follow your health care provider's instructions on monitoring your cholesterol and blood pressure. This information is not intended to replace advice given  to you by your health care provider. Make sure you discuss any questions you have with your health care provider. Document Revised: 03/15/2018 Document Reviewed: 03/15/2018 Elsevier Patient Education  2021 [employer].

## 2020-07-03 NOTE — Progress Notes (Signed)
Patient ID: Robert Reyes, male    DOB: Aug 13, 1965  MRN: [id number]  CC: Annual Exam   Subjective: Robert Reyes is a 55 y.o. male who presents for annual exam His concerns today include:  HTN, former tob dep,recurrentangioedema, mild OSA/PLMD, ED, COVID-19 infection  HM:  Due for colonoscopy.  Scheduled for 08/27/2020. COVID booster showing on HM but pt states he had the booster.  He does not have his vaccine card with him today. Agree to hep C screening Had shingles about 7-8 yrs ago.  Agreeable to Shingrix vaccine today.  Overwgh: discussed on last visit Weight has remained fairly stable.  He is eating 3 solid meals a day.  Snack on nuts and raisins.  Eating more fruits apples/oranges/pineapple. Constantly moves at work.  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  Patient states he has not been called as yet.  Patient Active Problem List   Diagnosis Date Noted  . Hyperlipidemia 11/22/2018  . Former smoker 11/22/2018  . Dehydration 05/30/2018  . Benign essential HTN 05/30/2018  . Allergic reaction 10/12/2016  . Hypertension 07/22/2016  . Angioedema 07/02/2016     Current Outpatient Medications on File Prior to Visit  Medication Sig Dispense Refill  . amLODipine (NORVASC) 10 MG tablet Take 1 tablet (10 mg total) by mouth daily. 90 tablet 1  . carvedilol (COREG) 3.125 MG tablet Take 1 tablet (3.125 mg total) by mouth 2 (two) times daily with a meal. 180 tablet 1  . cloNIDine (CATAPRES) 0.2 MG tablet Take 1 tablet (0.2 mg total) by mouth 2 (two) times daily. 180 tablet 1  . EPINEPHrine 0.3 mg/0.3 mL IJ SOAJ injection Inject 0.3 mLs (0.3 mg total) into the muscle as needed for anaphylaxis. 1 each 1  . sildenafil (VIAGRA) 50 MG tablet Take 1 tablet half hour to 1 hour before sexual intercourse.  Limit use to 1 tablet per 24 hours. (Patient not taking: No sig reported) 10  tablet 2   No current facility-administered medications on file prior to visit.    Allergies  Allergen Reactions  . Lidocaine Swelling  . Lisinopril Swelling and Other (See Comments)    Angioedema with Lisinopril/ HCTZ  . Ace Inhibitors Swelling and Other (See Comments)    Angioedema  . Blueberry Flavor     Blueberries angioedema    Social History   Socioeconomic History  . Marital status: Single    Spouse name: Not on file  . Number of children: Not on file  . Years of education: Not on file  . Highest education level: Not on file  Occupational History  . Not on file  Tobacco Use  . Smoking status: Former Smoker    Types: Cigarettes  . Smokeless tobacco: Former Network engineer  . Vaping Use: Never used  Substance and Sexual Activity  . Alcohol use: Not Currently    Comment: occ  . Drug use: Not Currently    Types: Marijuana    Comment: stopped 2-3 mths ago  05/2016  . Sexual activity: Yes    Partners: Female  Other Topics Concern  . Not on file  Social History Narrative  . Not on file   Social Determinants of Health   Financial Resource Strain: Not on file  Food Insecurity: Not on file  Transportation Needs: Not on file  Physical Activity: Not on file  Stress: Not on file  Social Connections: Not on file  Intimate Partner Violence: Not on file    Family History  Problem Relation Age of Onset  . Urticaria Mother   . Angioedema Brother   . Asthma Neg Hx   . Allergic rhinitis Neg Hx   . Eczema Neg Hx   . Immunodeficiency Neg Hx     Past Surgical History:  Procedure Laterality Date  . surgery on genitals    . tracheosotmy      ROS: Review of Systems  Constitutional: Negative for fever.  HENT: Negative for congestion, ear pain, hearing loss, sore throat and voice change.   Respiratory: Negative for shortness of breath.   Cardiovascular: Negative for chest pain, palpitations and leg swelling.  Gastrointestinal: Negative for abdominal pain and  blood in stool.  Endocrine: Negative for cold intolerance and heat intolerance.  Genitourinary: Negative for difficulty urinating, dysuria, hematuria and urgency.  Musculoskeletal: Negative for myalgias.       Some soreness in RT knee which he attributes to having to kneel 2-3 mins about 15-20 times a day to adjust work machine.  No swelling but stiffness once he gets home and sits down. Some popping when he flexes and extends the knee jt. Does not take anything for pain  Neurological: Negative for dizziness, syncope and headaches.  Hematological: Does not bruise/bleed easily.  Psychiatric/Behavioral: Negative for dysphoric mood. The patient is not nervous/anxious and is not hyperactive.      PHYSICAL EXAM: BP 119/82 (BP Location: Right Arm, Patient Position: Sitting)   Pulse 79   Ht 5' 11.5" (1.816 m)   Wt 198 lb 6.4 oz (90 kg)   SpO2 96%   BMI 27.29 kg/m   Wt Readings from Last 3 Encounters:  07/03/20 198 lb 6.4 oz (90 kg)  05/16/20 197 lb 12.8 oz (89.7 kg)  10/16/19 198 lb (89.8 kg)    Physical Exam General appearance - alert, well appearing, middle-age African-American male and in no distress Mental status - normal mood, behavior, speech, dress, motor activity, and thought processes Eyes - pupils equal and reactive, extraocular eye movements intact Ears - bilateral TM's and external ear canals normal Nose - normal and patent, no erythema, discharge or polyps Mouth - mucous membranes moist, pharynx normal without lesions Neck - supple, no significant adenopathy Lymphatics - no palpable lymphadenopathy, no hepatosplenomegaly Chest - clear to auscultation, no wheezes, rales or rhonchi, symmetric air entry Heart - normal rate, regular rhythm, normal S1, S2, no murmurs, rubs, clicks or gallops Abdomen - soft, nontender, nondistended, no masses or organomegaly Neurological - cranial nerves II through XII intact, motor and sensory grossly normal bilaterally, normal muscle tone, no  tremors, strength 5/5 Musculoskeletal -right knee: Mild soft tissue edema.  No point tenderness.  Good passive range of motion without crepitus.  Left knee: No joint enlargement or tissue edema.  No point tenderness.  Good range of motion. Extremities -no lower extremity edema Skin -hyperpigmentation over the lower aspect of the right knee  CMP Latest Ref Rng & Units 04/26/2019 11/20/2018 05/30/2018  Glucose 65 - 99 mg/dL 98 97 137(H)  BUN 6 - 24 mg/dL 13 16 17   Creatinine 0.76 - 1.27 mg/dL 1.02 1.35(H) 1.47(H)  Sodium 134 - 144 mmol/L 142 142 139  Potassium 3.5 - 5.2 mmol/L 4.8 5.3(H) 4.1  Chloride 96 - 106 mmol/L 104 104 105  CO2 20 - 29 mmol/L 25 23 25   Calcium 8.7 - 10.2 mg/dL 9.7 9.8 9.1  Total Protein 6.0 -  8.5 g/dL - 7.1 -  Total Bilirubin 0.0 - 1.2 mg/dL - 1.0 -  Alkaline Phos 39 - 117 IU/L - 75 -  AST 0 - 40 IU/L - 17 -  ALT 0 - 44 IU/L - 18 -   Lipid Panel     Component Value Date/Time   CHOL 173 11/20/2018 1122   TRIG 98 11/20/2018 1122   HDL 43 11/20/2018 1122   CHOLHDL 4.0 11/20/2018 1122   LDLCALC 110 (H) 11/20/2018 1122    CBC    Component Value Date/Time   WBC 9.1 05/30/2018 1508   RBC 4.28 05/30/2018 1508   HGB 12.3 (L) 05/30/2018 1508   HGB 14.4 01/05/2018 1145   HCT 40.3 05/30/2018 1508   HCT 42.8 01/05/2018 1145   PLT 358 05/30/2018 1508   PLT 239 01/05/2018 1145   MCV 94.2 05/30/2018 1508   MCV 91 01/05/2018 1145   MCH 28.7 05/30/2018 1508   MCHC 30.5 05/30/2018 1508   RDW 14.3 05/30/2018 1508   RDW 14.2 01/05/2018 1145   LYMPHSABS 2.6 05/30/2018 1508   MONOABS 0.6 05/30/2018 1508   EOSABS 0.1 05/30/2018 1508   BASOSABS 0.1 05/30/2018 1508    ASSESSMENT AND PLAN: 1. Encounter for annual health examination -Commended him on healthy eating habits.  Encouraged him to expand his choices of fruits to include berries which are high in antioxidants. -Encouraged him to continue to remain active with goal of getting in at least about 150 minutes/week  total of moderate intensity exercise.  It sounds as though he gets in a lot of exercise at work. -BMI is 27 with goal being 19-24.9. -Encouraged to get in at least 8 to 9 hours of sleep at nights.  2. Need for hepatitis C screening test Patient agreeable to screening. - Hepatitis C Antibody  3. Need for shingles vaccine Given today.  4. Chronic pain of right knee I think this is related to the repeated mechanical strain on the knee from intermittent kneeling on this knee during the day at work. Advised that he purchase a pair of kneepads and wear them at work  Also recommend purchasing diclofenac's gel over-the-counter and using as needed.  5. Screening for colon cancer Patient to keep appointment with the gastroenterologist for his colonoscopy in May of this year.  In regards to the home sleep study, I will have my CMA call [facility] sleep lab to inquire about this for him.   Patient was given the opportunity to ask questions.  Patient verbalized understanding of the plan and was able to repeat key elements of the plan.   No orders of the defined types were placed in this encounter.    Requested Prescriptions    No prescriptions requested or ordered in this encounter    No follow-ups on file.  Karle Plumber, MD, FACP

## 2020-07-04 LAB — HEPATITIS C ANTIBODY: Hep C Virus Ab: <0.1 s/co ratio (ref 0.0–0.9)

## 2020-07-04 NOTE — Telephone Encounter (Signed)
[place] and and spoke to [place]. Per Coralyn Mark she will reach out to pt insurance and then contact pt to schedule

## 2020-07-09 ENCOUNTER — Telehealth: Payer: Self-pay

## 2020-07-09 NOTE — Telephone Encounter (Signed)
Contacted pt to go over lab results pt didn't answer lvm  

## 2020-08-13 ENCOUNTER — Encounter: Payer: BC Managed Care – PPO | Admitting: Internal Medicine

## 2020-08-13 ENCOUNTER — Other Ambulatory Visit: Payer: Self-pay

## 2020-08-13 ENCOUNTER — Ambulatory Visit (HOSPITAL_BASED_OUTPATIENT_CLINIC_OR_DEPARTMENT_OTHER): Payer: BC Managed Care – PPO | Attending: Internal Medicine | Admitting: Internal Medicine

## 2020-08-13 ENCOUNTER — Telehealth: Payer: Self-pay | Admitting: *Deleted

## 2020-08-13 DIAGNOSIS — R400 Somnolence: Secondary | ICD-10-CM | POA: Insufficient documentation

## 2020-08-13 DIAGNOSIS — G4733 Obstructive sleep apnea (adult) (pediatric): Secondary | ICD-10-CM | POA: Insufficient documentation

## 2020-08-13 NOTE — Telephone Encounter (Signed)
Unable to reach patient for virtual pre visit.  Message left asking patient to return call to avoid cancellation of up-coming colonoscopy 08/27/20.

## 2020-08-15 NOTE — Telephone Encounter (Signed)
Patient rescheduled for pre visit 08/19/20

## 2020-08-19 ENCOUNTER — Other Ambulatory Visit: Payer: Self-pay

## 2020-08-19 ENCOUNTER — Ambulatory Visit (AMBULATORY_SURGERY_CENTER): Payer: BC Managed Care – PPO | Admitting: *Deleted

## 2020-08-19 VITALS — Ht 71.0 in | Wt 190.0 lb

## 2020-08-19 DIAGNOSIS — Z1211 Encounter for screening for malignant neoplasm of colon: Secondary | ICD-10-CM

## 2020-08-19 MED ORDER — PEG 3350-KCL-NA BICARB-NACL 420 G PO SOLR
4000.0000 mL | Freq: Once | ORAL | 0 refills | Status: AC
  Filled 2020-08-19: qty 4000, 1d supply, fill #0

## 2020-08-19 NOTE — Progress Notes (Signed)
Patient's pre-visit was done today over the phone with the patient due to COVID-19 pandemic.   Name,DOB and address verified. Insurance verified. Patient denies any allergies to Eggs and Soy. Patient denies any problems with anesthesia/sedation. Patient denies taking diet pills or blood thinners. Packet of Prep instructions mailed to patient including a copy of a consent form-pt is aware. Patient understands to call us back with any questions or concerns. Patient is aware of our care-partner policy and Covid-19 safety protocol.    The patient is COVID-19 vaccinated, per patient.  

## 2020-08-20 ENCOUNTER — Other Ambulatory Visit: Payer: Self-pay

## 2020-08-22 ENCOUNTER — Other Ambulatory Visit: Payer: Self-pay

## 2020-08-22 ENCOUNTER — Encounter: Payer: Self-pay | Admitting: Gastroenterology

## 2020-08-23 ENCOUNTER — Other Ambulatory Visit: Payer: Self-pay | Admitting: Internal Medicine

## 2020-08-23 DIAGNOSIS — G4733 Obstructive sleep apnea (adult) (pediatric): Secondary | ICD-10-CM

## 2020-08-23 NOTE — Procedures (Signed)
   Patient Name: Derion, Kreiter Date: 08/18/2020 Gender: Male D.O.B: Mar 24, 1966 Age (years): 47 Referring Provider: Karle Plumber MD Height (inches): 5 Interpreting Physician: Baird Lyons MD, ABSM Weight (lbs): 190 RPSGT: Jacolyn Reedy BMI: 26 MRN: [id number] Neck Size: 16.50  CLINICAL INFORMATION Sleep Study Type: HST Indication for sleep study: OSA Epworth Sleepiness Score: 4  SLEEP STUDY TECHNIQUE A multi-channel overnight portable sleep study was performed. The channels recorded were: nasal airflow, thoracic respiratory movement, and oxygen saturation with a pulse oximetry. Snoring was also monitored.   MEDICATIONS Patient self administered medications include: none reported.  SLEEP ARCHITECTURE Patient was studied for 416.2 minutes. The sleep efficiency was 99.4 % and the patient was supine for 33.8%. The arousal index was 0.0 per hour.  RESPIRATORY PARAMETERS The overall AHI was 31.7 per hour, with a central apnea index of 0 per hour. The oxygen nadir was 85% during sleep.  CARDIAC DATA Mean heart rate during sleep was 75.5 bpm.  IMPRESSIONS - Severe obstructive sleep apnea occurred during this study (AHI = 31.7/h). - Oxygen desaturation was noted during this study (Min O2 = 85%). Mean O2 saturation 97%. - Patient snored.Marland Kitchen  DIAGNOSIS - Obstructive Sleep Apnea (G47.33)  RECOMMENDATIONS - Suggest CPAP titration sleep study or autopap. Other options would be based on clinical judgment. - Be careful with alcohol, sedatives and other CNS depressants that may worsen sleep apnea and disrupt normal sleep architecture. - Sleep hygiene should be reviewed to assess factors that may improve sleep quality. - Weight management and regular exercise should be initiated or continued.  [Electronically signed] 08/23/2020 10:21 AM  Baird Lyons MD, ABSM Diplomate, American Board of Sleep Medicine   NPI: [id number]                          [place], [place] [facility]  ELECTRONICALLY SIGNED ON:  08/23/2020, 10:08 AM [place] PH: [phone]   FX: [phone] [place]

## 2020-08-23 NOTE — Progress Notes (Signed)
Let pt know that his sleep study showed that he has severe sleep apnea.  Will need to have a second part of the study done called CPAP titration where he will be placed on the CPAP machine and pressure titrated to establish the level of pressure that will be needed for a home device.  Referral submitted.

## 2020-08-25 ENCOUNTER — Telehealth: Payer: Self-pay

## 2020-08-25 NOTE — Telephone Encounter (Signed)
Contacted pt to go over sleep study results pt didn't answer lvm

## 2020-08-26 ENCOUNTER — Telehealth: Payer: Self-pay

## 2020-08-26 NOTE — Telephone Encounter (Signed)
Pt returned call and went over sleep study results pt doesn't have any questions or concerns

## 2020-08-27 ENCOUNTER — Encounter: Payer: Self-pay | Admitting: Gastroenterology

## 2020-08-27 ENCOUNTER — Other Ambulatory Visit: Payer: Self-pay

## 2020-08-27 ENCOUNTER — Ambulatory Visit (AMBULATORY_SURGERY_CENTER): Payer: BC Managed Care – PPO | Admitting: Gastroenterology

## 2020-08-27 VITALS — BP 129/96 | HR 62 | Temp 97.5°F | Resp 16 | Ht 71.0 in | Wt 190.0 lb

## 2020-08-27 DIAGNOSIS — D125 Benign neoplasm of sigmoid colon: Secondary | ICD-10-CM

## 2020-08-27 DIAGNOSIS — D124 Benign neoplasm of descending colon: Secondary | ICD-10-CM

## 2020-08-27 DIAGNOSIS — Z1211 Encounter for screening for malignant neoplasm of colon: Secondary | ICD-10-CM | POA: Diagnosis present

## 2020-08-27 MED ORDER — SODIUM CHLORIDE 0.9 % IV SOLN: 500.0000 mL | Freq: Once | INTRAVENOUS | Status: DC

## 2020-08-27 NOTE — Patient Instructions (Signed)
Thank you for allowing [place] to care for you today!  Await final pathology of polyps removed.  Will make recommendation at that time for a future colonoscopy.  Resume previous diet and medications today.  Return to your normal daily activities tomorrow  08/28/2020.   YOU HAD AN ENDOSCOPIC PROCEDURE TODAY AT [facility]:   Refer to the procedure report that was given to you for any specific questions about what was found during the examination.  If the procedure report does not answer your questions, please call your gastroenterologist to clarify.  If you requested that your care partner not be given the details of your procedure findings, then the procedure report has been included in a sealed envelope for you to review at your convenience later.  YOU SHOULD EXPECT: Some feelings of bloating in the abdomen. Passage of more gas than usual.  Walking can help get rid of the air that was put into your GI tract during the procedure and reduce the bloating. If you had a lower endoscopy (such as a colonoscopy or flexible sigmoidoscopy) you may notice spotting of blood in your stool or on the toilet paper. If you underwent a bowel prep for your procedure, you may not have a normal bowel movement for a few days.  Please Note:  You might notice some irritation and congestion in your nose or some drainage.  This is from the oxygen used during your procedure.  There is no need for concern and it should clear up in a day or so.  SYMPTOMS TO REPORT IMMEDIATELY:   Following lower endoscopy (colonoscopy or flexible sigmoidoscopy):  Excessive amounts of blood in the stool  Significant tenderness or worsening of abdominal pains  Swelling of the abdomen that is new, acute  Fever of 100F or higher   For urgent or emergent issues, a gastroenterologist can be reached at any hour by calling [phone]. Do not use MyChart messaging for urgent concerns.    DIET:  We do recommend a small meal at  first, but then you may proceed to your regular diet.  Drink plenty of fluids but you should avoid alcoholic beverages for 24 hours.  ACTIVITY:  You should plan to take it easy for the rest of today and you should NOT DRIVE or use heavy machinery until tomorrow (because of the sedation medicines used during the test).    FOLLOW UP: Our staff will call the number listed on your records 48-72 hours following your procedure to check on you and address any questions or concerns that you may have regarding the information given to you following your procedure. If we do not reach you, we will leave a message.  We will attempt to reach you two times.  During this call, we will ask if you have developed any symptoms of COVID 19. If you develop any symptoms (ie: fever, flu-like symptoms, shortness of breath, cough etc.) before then, please call [phone].  If you test positive for Covid 19 in the 2 weeks post procedure, please call and report this information to [place].    If any biopsies were taken you will be contacted by phone or by letter within the next 1-3 weeks.  Please call us at [phone] if you have not heard about the biopsies in 3 weeks.    SIGNATURES/CONFIDENTIALITY: You and/or your care partner have signed paperwork which will be entered into your electronic medical record.  These signatures attest to the fact that that the  information above on your After Visit Summary has been reviewed and is understood.  Full responsibility of the confidentiality of this discharge information lies with you and/or your care-partner.

## 2020-08-27 NOTE — Progress Notes (Signed)
To PACU, VSS. Report to [email or web address] 

## 2020-08-27 NOTE — Progress Notes (Signed)
Sh - vs  Pt's states no medical or surgical changes since previsit or office visit.

## 2020-08-27 NOTE — Progress Notes (Signed)
Pt started Golytely Monday night and drank 1/2 of prep.  Yesterday evening he drank the other 1/2 of prep.  Pt fate a hamburger yesterday am 09:30.  He reports clear yellow colored liquid on prep results.  [place]   SH - VS

## 2020-08-27 NOTE — Op Note (Signed)
[facility] Patient Name: Robert Reyes Procedure Date: 08/27/2020 10:28 AM MRN: [id number] Endoscopist: Ladene Artist , MD Age: 55 Referring MD:  Date of Birth: 22-Apr-1965 Gender: Male Account #: [id number] Procedure:                Colonoscopy Indications:              Screening for colorectal malignant neoplasm Medicines:                Monitored Anesthesia Care Procedure:                Pre-Anesthesia Assessment:                           - Prior to the procedure, a History and Physical                            was performed, and patient medications and                            allergies were reviewed. The patient's tolerance of                            previous anesthesia was also reviewed. The risks                            and benefits of the procedure and the sedation                            options and risks were discussed with the patient.                            All questions were answered, and informed consent                            was obtained. Prior Anticoagulants: The patient has                            taken no previous anticoagulant or antiplatelet                            agents. ASA Grade Assessment: II - A patient with                            mild systemic disease. After reviewing the risks                            and benefits, the patient was deemed in                            satisfactory condition to undergo the procedure.                           After obtaining informed consent, the colonoscope  was passed under direct vision. Throughout the                            procedure, the patient's blood pressure, pulse, and                            oxygen saturations were monitored continuously. The                            Olympus [id number]) [id number] was introduced                            through the anus and advanced to the the cecum,                            identified  by appendiceal orifice and ileocecal                            valve. The ileocecal valve, appendiceal orifice,                            and rectum were photographed. The quality of the                            bowel preparation was good after extensive lavage,                            suction. The colonoscopy was performed without                            difficulty. The patient tolerated the procedure                            well. Scope In: 10:38:01 AM Scope Out: 10:55:30 AM Scope Withdrawal Time: 0 hours 15 minutes 25 seconds  Total Procedure Duration: 0 hours 17 minutes 29 seconds  Findings:                 The perianal and digital rectal examinations were                            normal.                           Two sessile polyps were found in the sigmoid colon                            and descending colon. The polyps were 6 to 7 mm in                            size. These polyps were removed with a cold snare.                            Resection and retrieval were complete.  A few small-mouthed diverticula were found in the                            ascending colon. There was no evidence of                            diverticular bleeding.                           A few small-mouthed diverticula were found in the                            left colon. There was no evidence of diverticular                            bleeding.                           Internal hemorrhoids were found during                            retroflexion. The hemorrhoids were small and Grade                            I (internal hemorrhoids that do not prolapse).                           The exam was otherwise without abnormality on                            direct and retroflexion views. Complications:            No immediate complications. Estimated blood loss:                            None. Estimated Blood Loss:     Estimated blood loss:  none. Impression:               - Two 6 to 7 mm polyps in the sigmoid colon and in                            the descending colon, removed with a cold snare.                            Resected and retrieved.                           - Mild diverticulosis in the left colon.                           - Internal hemorrhoids.                           - The examination was otherwise normal on direct  and retroflexion views. Recommendation:           - Repeat colonoscopy after studies are complete for                            surveillance based on pathology results.                           - Patient has a contact number available for                            emergencies. The signs and symptoms of potential                            delayed complications were discussed with the                            patient. Return to normal activities tomorrow.                            Written discharge instructions were provided to the                            patient.                           - High fiber diet.                           - Continue present medications.                           - Await pathology results. Ladene Artist, MD 08/27/2020 11:07:08 AM This report has been signed electronically.

## 2020-08-27 NOTE — Progress Notes (Signed)
Called to room to assist during endoscopic procedure.  Patient ID and intended procedure confirmed with present staff. Received instructions for my participation in the procedure from the performing physician.  

## 2020-08-29 ENCOUNTER — Telehealth: Payer: Self-pay

## 2020-08-29 ENCOUNTER — Encounter: Payer: Self-pay | Admitting: Gastroenterology

## 2020-08-29 NOTE — Telephone Encounter (Signed)
  Follow up Call-  Call back number 08/27/2020  Post procedure Call Back phone  # [phone] cell  Permission to leave phone message Yes  Some recent data might be hidden     Patient questions:  Do you have a fever, pain , or abdominal swelling? No. Pain Score  0 *  Have you tolerated food without any problems? Yes.    Have you been able to return to your normal activities? Yes.    Do you have any questions about your discharge instructions: Diet   No. Medications  No. Follow up visit  No.  Do you have questions or concerns about your Care? No.  Actions: * If pain score is 4 or above: No action needed, pain <4.  1. Have you developed a fever since your procedure? no  2.   Have you had an respiratory symptoms (SOB or cough) since your procedure? no  3.   Have you tested positive for COVID 19 since your procedure no  4.   Have you had any family members/close contacts diagnosed with the COVID 19 since your procedure?  no   If yes to any of these questions please route to Joylene John, RN and Joella Prince, RN

## 2020-09-10 ENCOUNTER — Ambulatory Visit: Payer: BC Managed Care – PPO

## 2020-10-30 ENCOUNTER — Telehealth: Payer: Self-pay | Admitting: Internal Medicine

## 2020-10-30 NOTE — Telephone Encounter (Signed)
Left pt message  about provider being virtual for mondays appt and if any question please call the office

## 2020-11-03 ENCOUNTER — Ambulatory Visit: Payer: Self-pay | Attending: Internal Medicine | Admitting: Internal Medicine

## 2020-11-03 ENCOUNTER — Encounter: Payer: Self-pay | Admitting: Internal Medicine

## 2020-11-03 ENCOUNTER — Other Ambulatory Visit: Payer: Self-pay

## 2020-11-03 DIAGNOSIS — D126 Benign neoplasm of colon, unspecified: Secondary | ICD-10-CM

## 2020-11-03 DIAGNOSIS — Z23 Encounter for immunization: Secondary | ICD-10-CM

## 2020-11-03 DIAGNOSIS — I10 Essential (primary) hypertension: Secondary | ICD-10-CM

## 2020-11-03 DIAGNOSIS — G4733 Obstructive sleep apnea (adult) (pediatric): Secondary | ICD-10-CM | POA: Insufficient documentation

## 2020-11-03 MED ORDER — AMLODIPINE BESYLATE 10 MG PO TABS
ORAL_TABLET | Freq: Every day | ORAL | 1 refills | Status: AC
  Filled 2020-11-03 – 2020-12-25 (×2): qty 30, 30d supply, fill #0
  Filled 2021-02-11: qty 30, 30d supply, fill #1

## 2020-11-03 MED ORDER — CLONIDINE HCL 0.2 MG PO TABS
ORAL_TABLET | ORAL | 1 refills | Status: AC
  Filled 2020-11-03 – 2020-12-25 (×2): qty 60, 30d supply, fill #0
  Filled 2021-02-11: qty 60, 30d supply, fill #1

## 2020-11-03 MED ORDER — CARVEDILOL 3.125 MG PO TABS
ORAL_TABLET | Freq: Two times a day (BID) | ORAL | 1 refills | Status: AC
  Filled 2020-11-03 – 2020-12-25 (×2): qty 60, 30d supply, fill #0
  Filled 2021-02-11: qty 60, 30d supply, fill #1

## 2020-11-03 NOTE — Progress Notes (Signed)
Virtual Visit via Telephone Note  I connected with Robert Reyes on 11/03/2020 at 9:52 a.m by telephone and verified that I am speaking with the correct person using two identifiers  Location: Patient: home Provider: office  Participants: Myself Patient   I discussed the limitations, risks, security and privacy concerns of performing an evaluation and management service by telephone and the availability of in person appointments. I also discussed with the patient that there may be a patient responsible charge related to this service. The patient expressed understanding and agreed to proceed.   History of Present Illness: HTN, former tob dep, recurrent angioedema, severe OSA/PLMD, ED, COVID-19 infection.  Last seen 06/2020.  Today's visit is for chronic ds management.  OSA: Had sleep study done in May 2022.  It revealed severe obstructive sleep apnea.  I referred him for CPAP titration.  Patient states he has not been called as yet with an appointment.  Had c-scope since last visit.  He had 2 polyps removed 1 of which was hyperplastic and the other one was a tubular adenoma.  He will be due for repeat colonoscopy in 7 years.  HTN:   HYPERTENSION Currently taking: see medication list.  He is on Norvasc, carvedilol and clonidine. Med Adherence: '[x]'$  Yes    '[]'$  No Medication side effects: '[]'$  Yes    '[x]'$  No Adherence with salt restriction: '[x]'$  Yes    '[]'$  No Home Monitoring?: '[x]'$  Yes    '[]'$  No Monitoring Frequency: He states that his wife checks his blood pressure at least about once a week and it has been good. Home BP results range: He does not recall the range or has any numbers to give to me today. SOB? '[]'$  Yes    '[x]'$  No Chest Pain?: '[]'$  Yes    '[x]'$  No Leg swelling?: '[]'$  Yes    '[x]'$  No Headaches?: '[]'$  Yes    '[x]'$  No Dizziness? '[]'$  Yes    '[x]'$  No Comments:   HM:  due for 2nd Zoster vaccine.  Outpatient Encounter Medications as of 11/03/2020  Medication Sig   amLODipine (NORVASC) 10 MG  tablet TAKE 1 TABLET (10 MG TOTAL) BY MOUTH DAILY.   carvedilol (COREG) 3.125 MG tablet TAKE 1 TABLET (3.125 MG TOTAL) BY MOUTH 2 (TWO) TIMES DAILY WITH A MEAL.   cloNIDine (CATAPRES) 0.2 MG tablet TAKE 1 TABLET (0.2 MG TOTAL) BY MOUTH 2 (TWO) TIMES DAILY.   EPINEPHrine 0.3 mg/0.3 mL IJ SOAJ injection Inject 0.3 mLs (0.3 mg total) into the muscle as needed for anaphylaxis. (Patient not taking: No sig reported)   sildenafil (VIAGRA) 50 MG tablet Take 1 tablet half hour to 1 hour before sexual intercourse.  Limit use to 1 tablet per 24 hours. (Patient not taking: No sig reported)   No facility-administered encounter medications on file as of 11/03/2020.    Observations/Objective: Results for orders placed or performed in visit on 07/03/20  Hepatitis C Antibody  Result Value Ref Range   Hep C Virus Ab <0.1 0.0 - 0.9 s/co ratio     Assessment and Plan: 1. Essential hypertension Patient to continue current blood pressure medications and low-salt diet.  He will come to the lab to have his baseline blood test done.  Refills sent on medications. - carvedilol (COREG) 3.125 MG tablet; TAKE 1 TABLET (3.125 MG TOTAL) BY MOUTH 2 (TWO) TIMES DAILY WITH A MEAL.  Dispense: 180 tablet; Refill: 1 - amLODipine (NORVASC) 10 MG tablet; TAKE 1 TABLET (10 MG TOTAL) BY  MOUTH DAILY.  Dispense: 90 tablet; Refill: 1 - cloNIDine (CATAPRES) 0.2 MG tablet; TAKE 1 TABLET (0.2 MG TOTAL) BY MOUTH 2 (TWO) TIMES DAILY.  Dispense: 180 tablet; Refill: 1 - CBC; Future - Comprehensive metabolic panel; Future - Lipid panel; Future  2. OSA (obstructive sleep apnea) Message sent to my CMA to call the sleep lab at [facility] to try to arrange his CPAP titration study. Patient advised to be careful with alcohol use or any sedative medications as these can worsen sleep apnea.  Good sleep hygiene discussed and encouraged.  3. Adenomatous polyp of colon, unspecified part of colon Plan for repeat colonoscopy in 7 years as  recommended by gastroenterology.  4. Need for shingles vaccine Message sent to schedulers to have him schedule to see the clinical pharmacist for his second shingles vaccine.   Follow Up Instructions: 4 mths   I discussed the assessment and treatment plan with the patient. The patient was provided an opportunity to ask questions and all were answered. The patient agreed with the plan and demonstrated an understanding of the instructions.   The patient was advised to call back or seek an in-person evaluation if the symptoms worsen or if the condition fails to improve as anticipated.  I  Spent 9 minutes on this telephone encounter  Karle Plumber, MD

## 2020-11-10 ENCOUNTER — Other Ambulatory Visit: Payer: Self-pay

## 2020-11-14 ENCOUNTER — Telehealth: Payer: Self-pay | Admitting: Internal Medicine

## 2020-11-14 NOTE — Telephone Encounter (Signed)
-----   Message from Ladell Pier, MD sent at 11/03/2020 10:05 AM EDT ----- Follow-up with me in 4 months. Give appointment with Lurena Joiner for second shingles vaccine.

## 2020-11-14 NOTE — Telephone Encounter (Signed)
Patient need to be scheduled with Dr. Wynetta Emery in December for 4 month f/u and scheduled with Lurena Joiner for 2nd shingle vaccine. Left vm for patient to call  [phone] to make appts.

## 2020-12-25 ENCOUNTER — Other Ambulatory Visit: Payer: Self-pay

## 2021-02-11 ENCOUNTER — Other Ambulatory Visit: Payer: Self-pay

## 2021-02-11 ENCOUNTER — Other Ambulatory Visit: Payer: Self-pay | Admitting: Internal Medicine

## 2021-02-11 DIAGNOSIS — I10 Essential (primary) hypertension: Secondary | ICD-10-CM

## 2021-02-11 NOTE — Telephone Encounter (Signed)
Pt needs cloNIDine (CATAPRES) 0.2 MG tablet   2)amLODipine (NORVASC) 10 MG tablet   3)carvedilol (COREG) 3.125 MG tablet called into [facility] [address] [place], [place] [address]  Phone:  [phone]  Fax:  [phone]  DEA #:  [id number]

## 2021-02-11 NOTE — Telephone Encounter (Signed)
Call to pharmacy- patient has active RF on all listed medication request- call to patient- left message to call pharmacy for RF- they are on file.  Requested Prescriptions  Pending Prescriptions Disp Refills  . cloNIDine (CATAPRES) 0.2 MG tablet 180 tablet 1    Sig: TAKE 1 TABLET (0.2 MG TOTAL) BY MOUTH 2 (TWO) TIMES DAILY.     Cardiovascular:  Alpha-2 Agonists Failed - 02/11/2021 10:38 AM      Failed - Last BP in normal range    BP Readings from Last 1 Encounters:  08/27/20 (!) 129/96         Passed - Last Heart Rate in normal range    Pulse Readings from Last 1 Encounters:  08/27/20 62         Passed - Valid encounter within last 6 months    Recent Outpatient Visits          3 months ago Essential hypertension   [place] Ladell Pier, MD   7 months ago Encounter for annual health examination   [place] Ladell Pier, MD   9 months ago [place] Ladell Pier, MD   1 year ago Essential hypertension   [place] Ladell Pier, MD   1 year ago Essential hypertension   [place], [place] Laming B, MD             . amLODipine (NORVASC) 10 MG tablet 90 tablet 1    Sig: TAKE 1 TABLET (10 MG TOTAL) BY MOUTH DAILY.     Cardiovascular:  Calcium Channel Blockers Failed - 02/11/2021 10:38 AM      Failed - Last BP in normal range    BP Readings from Last 1 Encounters:  08/27/20 (!) 129/96         Passed - Valid encounter within last 6 months    Recent Outpatient Visits          3 months ago Essential hypertension   [place] Ladell Pier, MD   7 months ago Encounter for annual health examination   [place] Ladell Pier, MD   9 months ago [place] Ladell Pier, MD   1 year ago Essential hypertension   [place] Ladell Pier, MD   1 year ago Essential hypertension   [place] Karle Plumber B, MD             . carvedilol (COREG) 3.125 MG tablet 180 tablet 1    Sig: TAKE 1 TABLET (3.125 MG TOTAL) BY MOUTH 2 (TWO) TIMES DAILY WITH A MEAL.     Cardiovascular:  Beta Blockers Failed - 02/11/2021 10:38 AM      Failed - Last BP in normal range    BP Readings from Last 1 Encounters:  08/27/20 (!) 129/96         Passed - Last Heart Rate in normal range    Pulse Readings from Last 1 Encounters:  08/27/20 62         Passed - Valid encounter within last 6 months    Recent Outpatient Visits          3 months ago Essential hypertension   [facility] Ladell Pier, MD   7 months ago Encounter for annual health examination   [place] Ladell Pier, MD   9 months ago [place] Ladell Pier, MD   1 year ago Essential hypertension   [place] Ladell Pier, MD   1 year ago Essential hypertension   [place] Ladell Pier, MD

## 2021-02-12 ENCOUNTER — Ambulatory Visit: Payer: Self-pay | Admitting: Physician Assistant

## 2021-08-19 ENCOUNTER — Telehealth: Payer: Self-pay | Admitting: Internal Medicine

## 2021-08-19 NOTE — Telephone Encounter (Signed)
I came across an expired order for CPAP titration study on this patient that was placed 08/2020.  Phone call placed to patient [phone])  to inquire whether he had this CPAP titration study done.  I note that he has relocated to [facility].  Left message for him to call us back. ?
